# Patient Record
Sex: Female | Born: 1960
Health system: Southern US, Community
[De-identification: ages and names within clinical notes are randomized; demographics above are authoritative.]

## PROBLEM LIST (undated history)

## (undated) DIAGNOSIS — R51 Headache: Secondary | ICD-10-CM

## (undated) DIAGNOSIS — R0981 Nasal congestion: Secondary | ICD-10-CM

## (undated) DIAGNOSIS — I493 Ventricular premature depolarization: Secondary | ICD-10-CM

## (undated) DIAGNOSIS — F419 Anxiety disorder, unspecified: Secondary | ICD-10-CM

## (undated) DIAGNOSIS — Z9889 Other specified postprocedural states: Secondary | ICD-10-CM

## (undated) DIAGNOSIS — R112 Nausea with vomiting, unspecified: Secondary | ICD-10-CM

## (undated) DIAGNOSIS — E039 Hypothyroidism, unspecified: Secondary | ICD-10-CM

## (undated) HISTORY — PX: TONSILLECTOMY: SUR1361

## (undated) HISTORY — PX: ABDOMINAL HYSTERECTOMY: SHX81

## (undated) HISTORY — PX: SHOULDER ARTHROSCOPY: SHX128

## (undated) HISTORY — PX: REDUCTION MAMMAPLASTY: SUR839

## (undated) HISTORY — PX: OTHER SURGICAL HISTORY: SHX169

## (undated) HISTORY — PX: THYROIDECTOMY, PARTIAL: SHX18

---

## 1997-12-23 ENCOUNTER — Encounter: Admission: RE | Admit: 1997-12-23 | Discharge: 1998-03-23 | Payer: Self-pay | Admitting: Obstetrics and Gynecology

## 1998-03-13 ENCOUNTER — Inpatient Hospital Stay (HOSPITAL_COMMUNITY): Admission: AD | Admit: 1998-03-13 | Discharge: 1998-03-16 | Payer: Self-pay | Admitting: Obstetrics and Gynecology

## 1998-05-19 ENCOUNTER — Ambulatory Visit (HOSPITAL_COMMUNITY): Admission: RE | Admit: 1998-05-19 | Discharge: 1998-05-19 | Payer: Self-pay | Admitting: Obstetrics and Gynecology

## 1999-01-07 ENCOUNTER — Other Ambulatory Visit: Admission: RE | Admit: 1999-01-07 | Discharge: 1999-01-07 | Payer: Self-pay | Admitting: Obstetrics and Gynecology

## 1999-01-11 ENCOUNTER — Encounter: Admission: RE | Admit: 1999-01-11 | Discharge: 1999-02-03 | Payer: Self-pay | Admitting: Orthopaedic Surgery

## 1999-05-26 ENCOUNTER — Ambulatory Visit (HOSPITAL_COMMUNITY): Admission: RE | Admit: 1999-05-26 | Discharge: 1999-05-26 | Payer: Self-pay | Admitting: Obstetrics and Gynecology

## 1999-05-26 ENCOUNTER — Encounter (INDEPENDENT_AMBULATORY_CARE_PROVIDER_SITE_OTHER): Payer: Self-pay

## 1999-10-05 ENCOUNTER — Inpatient Hospital Stay (HOSPITAL_COMMUNITY): Admission: RE | Admit: 1999-10-05 | Discharge: 1999-10-08 | Payer: Self-pay | Admitting: Obstetrics and Gynecology

## 1999-10-05 ENCOUNTER — Encounter (INDEPENDENT_AMBULATORY_CARE_PROVIDER_SITE_OTHER): Payer: Self-pay

## 2000-05-24 ENCOUNTER — Other Ambulatory Visit: Admission: RE | Admit: 2000-05-24 | Discharge: 2000-05-24 | Payer: Self-pay | Admitting: Obstetrics and Gynecology

## 2001-05-25 ENCOUNTER — Other Ambulatory Visit: Admission: RE | Admit: 2001-05-25 | Discharge: 2001-05-25 | Payer: Self-pay | Admitting: Obstetrics and Gynecology

## 2001-06-07 ENCOUNTER — Ambulatory Visit (HOSPITAL_BASED_OUTPATIENT_CLINIC_OR_DEPARTMENT_OTHER): Admission: RE | Admit: 2001-06-07 | Discharge: 2001-06-07 | Payer: Self-pay | Admitting: Orthopedic Surgery

## 2001-11-08 ENCOUNTER — Encounter: Payer: Self-pay | Admitting: Family Medicine

## 2001-11-08 ENCOUNTER — Encounter: Admission: RE | Admit: 2001-11-08 | Discharge: 2001-11-08 | Payer: Self-pay | Admitting: Family Medicine

## 2001-12-04 ENCOUNTER — Other Ambulatory Visit: Admission: RE | Admit: 2001-12-04 | Discharge: 2001-12-04 | Payer: Self-pay | Admitting: *Deleted

## 2001-12-13 ENCOUNTER — Encounter: Payer: Self-pay | Admitting: *Deleted

## 2001-12-14 ENCOUNTER — Encounter (INDEPENDENT_AMBULATORY_CARE_PROVIDER_SITE_OTHER): Payer: Self-pay | Admitting: Specialist

## 2001-12-14 ENCOUNTER — Inpatient Hospital Stay (HOSPITAL_COMMUNITY): Admission: RE | Admit: 2001-12-14 | Discharge: 2001-12-15 | Payer: Self-pay | Admitting: *Deleted

## 2002-07-02 ENCOUNTER — Other Ambulatory Visit: Admission: RE | Admit: 2002-07-02 | Discharge: 2002-07-02 | Payer: Self-pay | Admitting: Obstetrics and Gynecology

## 2002-08-23 ENCOUNTER — Encounter: Payer: Self-pay | Admitting: Emergency Medicine

## 2002-08-23 ENCOUNTER — Emergency Department (HOSPITAL_COMMUNITY): Admission: EM | Admit: 2002-08-23 | Discharge: 2002-08-23 | Payer: Self-pay | Admitting: Emergency Medicine

## 2003-07-22 ENCOUNTER — Other Ambulatory Visit: Admission: RE | Admit: 2003-07-22 | Discharge: 2003-07-22 | Payer: Self-pay | Admitting: Obstetrics and Gynecology

## 2003-07-23 ENCOUNTER — Encounter: Admission: RE | Admit: 2003-07-23 | Discharge: 2003-10-21 | Payer: Self-pay | Admitting: Internal Medicine

## 2003-11-10 ENCOUNTER — Encounter: Admission: RE | Admit: 2003-11-10 | Discharge: 2004-02-08 | Payer: Self-pay | Admitting: Internal Medicine

## 2004-03-17 ENCOUNTER — Observation Stay (HOSPITAL_COMMUNITY): Admission: RE | Admit: 2004-03-17 | Discharge: 2004-03-18 | Payer: Self-pay | Admitting: Orthopedic Surgery

## 2004-07-27 ENCOUNTER — Other Ambulatory Visit: Admission: RE | Admit: 2004-07-27 | Discharge: 2004-07-27 | Payer: Self-pay | Admitting: Obstetrics and Gynecology

## 2004-08-13 ENCOUNTER — Encounter: Admission: RE | Admit: 2004-08-13 | Discharge: 2004-08-13 | Payer: Self-pay | Admitting: Gastroenterology

## 2005-01-25 ENCOUNTER — Encounter: Admission: RE | Admit: 2005-01-25 | Discharge: 2005-01-25 | Payer: Self-pay | Admitting: Internal Medicine

## 2005-12-06 ENCOUNTER — Encounter: Admission: RE | Admit: 2005-12-06 | Discharge: 2005-12-06 | Payer: Self-pay | Admitting: Otolaryngology

## 2005-12-19 ENCOUNTER — Encounter: Admission: RE | Admit: 2005-12-19 | Discharge: 2005-12-19 | Payer: Self-pay | Admitting: Internal Medicine

## 2005-12-27 ENCOUNTER — Other Ambulatory Visit: Admission: RE | Admit: 2005-12-27 | Discharge: 2005-12-27 | Payer: Self-pay | Admitting: Interventional Radiology

## 2005-12-27 ENCOUNTER — Encounter: Admission: RE | Admit: 2005-12-27 | Discharge: 2005-12-27 | Payer: Self-pay | Admitting: Internal Medicine

## 2005-12-27 ENCOUNTER — Encounter (INDEPENDENT_AMBULATORY_CARE_PROVIDER_SITE_OTHER): Payer: Self-pay | Admitting: *Deleted

## 2006-02-13 ENCOUNTER — Encounter: Admission: RE | Admit: 2006-02-13 | Discharge: 2006-02-13 | Payer: Self-pay | Admitting: Endocrinology

## 2006-02-22 ENCOUNTER — Encounter (INDEPENDENT_AMBULATORY_CARE_PROVIDER_SITE_OTHER): Payer: Self-pay | Admitting: Specialist

## 2006-02-22 ENCOUNTER — Encounter: Admission: RE | Admit: 2006-02-22 | Discharge: 2006-02-22 | Payer: Self-pay | Admitting: Endocrinology

## 2006-02-22 ENCOUNTER — Other Ambulatory Visit: Admission: RE | Admit: 2006-02-22 | Discharge: 2006-02-22 | Payer: Self-pay | Admitting: Interventional Radiology

## 2006-05-15 ENCOUNTER — Encounter: Admission: RE | Admit: 2006-05-15 | Discharge: 2006-05-15 | Payer: Self-pay | Admitting: Endocrinology

## 2007-02-12 ENCOUNTER — Encounter: Admission: RE | Admit: 2007-02-12 | Discharge: 2007-02-12 | Payer: Self-pay | Admitting: Endocrinology

## 2007-03-23 ENCOUNTER — Encounter: Admission: RE | Admit: 2007-03-23 | Discharge: 2007-03-23 | Payer: Self-pay | Admitting: Obstetrics and Gynecology

## 2007-08-08 ENCOUNTER — Encounter: Admission: RE | Admit: 2007-08-08 | Discharge: 2007-08-08 | Payer: Self-pay | Admitting: Obstetrics and Gynecology

## 2008-01-01 ENCOUNTER — Encounter (INDEPENDENT_AMBULATORY_CARE_PROVIDER_SITE_OTHER): Payer: Self-pay | Admitting: Surgery

## 2008-01-01 ENCOUNTER — Ambulatory Visit (HOSPITAL_COMMUNITY): Admission: RE | Admit: 2008-01-01 | Discharge: 2008-01-02 | Payer: Self-pay | Admitting: Surgery

## 2008-12-10 ENCOUNTER — Encounter: Admission: RE | Admit: 2008-12-10 | Discharge: 2008-12-10 | Payer: Self-pay | Admitting: Internal Medicine

## 2009-02-27 ENCOUNTER — Emergency Department (HOSPITAL_COMMUNITY): Admission: EM | Admit: 2009-02-27 | Discharge: 2009-02-27 | Payer: Self-pay | Admitting: Emergency Medicine

## 2009-10-02 ENCOUNTER — Encounter: Admission: RE | Admit: 2009-10-02 | Discharge: 2009-10-02 | Payer: Self-pay | Admitting: Internal Medicine

## 2009-12-18 ENCOUNTER — Encounter: Admission: RE | Admit: 2009-12-18 | Discharge: 2009-12-18 | Payer: Self-pay | Admitting: Surgery

## 2010-03-20 ENCOUNTER — Ambulatory Visit: Payer: Self-pay | Admitting: Oral Surgery

## 2010-04-25 ENCOUNTER — Encounter: Payer: Self-pay | Admitting: Endocrinology

## 2010-06-07 ENCOUNTER — Other Ambulatory Visit: Payer: Self-pay | Admitting: Internal Medicine

## 2010-06-07 DIAGNOSIS — M545 Low back pain, unspecified: Secondary | ICD-10-CM

## 2010-06-08 ENCOUNTER — Ambulatory Visit
Admission: RE | Admit: 2010-06-08 | Discharge: 2010-06-08 | Disposition: A | Discharge status: Home / Self Care | Payer: Federal, State, Local not specified - PPO | Source: Ambulatory Visit | Attending: Internal Medicine | Admitting: Internal Medicine

## 2010-06-08 DIAGNOSIS — M545 Low back pain, unspecified: Secondary | ICD-10-CM

## 2010-08-17 NOTE — Op Note (Signed)
Alexis Knight, Alexis Knight                  ACCOUNT NO.:  [id number]   MEDICAL RECORD NO.:  [id number]          PATIENT TYPE:  OIB   LOCATION:  1336                         FACILITY:  [facility]   PHYSICIAN:  Velora Heckler, MD      DATE OF BIRTH:  1960/09/24   DATE OF PROCEDURE:  01/01/2008  DATE OF DISCHARGE:                               OPERATIVE REPORT   PREOPERATIVE DIAGNOSIS:  Left thyroid nodules.   POSTOPERATIVE DIAGNOSIS:  Left thyroid nodules.   PROCEDURE:  Left thyroid lobectomy.   SURGEON:  Velora Heckler, MD, FACS   ASSISTANT:  Claud Kelp, MD, FACS   ANESTHESIA:  General.   ESTIMATED BLOOD LOSS:  Minimal.   PREPARATION:  Betadine.   COMPLICATIONS:  None.   INDICATIONS:  The patient is a 50 year old white female from [place],  [place].  She has a 2-year history of thyroid nodules.  She has  had thorough evaluation including thyroid ultrasound and 2 previous fine-  needle aspiration biopsies.  Fine-needle aspiration cytology was  indeterminate.  Nuclear scan showed a cold nodule in the left thyroid  lobe.  Patient has been on thyroid hormone suppression.  At the  recommendation of her endocrinologist, Dr. Talmage Coin, the patient  comes to surgery for left thyroid lobectomy for definitive diagnosis.   BODY OF REPORT:  Procedure is done in OR #11 at the [facility].  The patient is brought to the operating room,  placed in a supine position on the operating room table.  Following  administration of general anesthesia, the patient is positioned and then  prepped and draped in usual strict aseptic fashion.  After ascertaining  that an adequate level of anesthesia had been achieved, a Kocher  incision is made with a #15 blade.  Dissection was carried through  subcutaneous tissues and platysma.  Hemostasis is obtained with  electrocautery.  Skin flaps are developed cephalad and caudad from the  thyroid notch to the sternal notch.  A Mahorner  self-retaining retractor  is placed for exposure.  Strap muscles are incised in the midline.  Initially, the right thyroid lobe is briefly exposed.  It is palpated.  There are no dominant nor discrete masses in the right lobe.  There is  no obvious lymphadenopathy.   Next, we turned our attention to the left thyroid lobe.  Strap muscles  were reflected laterally.  Left lobe was exposed with gentle blunt  dissection.  Middle thyroid vein is divided between Ligaclips with  Harmonic scalpel.  Gland is gently mobilized.  The superior pole vessels  are divided between medium Ligaclips with the Harmonic scalpel.  Parathyroid tissue is identified and preserved.  Inferior venous  tributaries are divided between Ligaclips with the Harmonic scalpel.  Gland is rolled anteriorly.  Recurrent nerve is identified and preserved  along its course.  Branches of the inferior thyroid artery are divided  between small Ligaclips with the Harmonic scalpel.  Dissection is  carried down to the ligament of Allyson Sabal which is transected with the  electrocautery.  Gland is rolled  anteriorly up and onto the anterior  surface of the trachea.  There is no significant pyramidal lobe.  Isthmus is mobilized across the midline.  The thyroid parenchyma is  divided with the Harmonic scalpel at the junction of the isthmus and  right thyroid lobe.  Specimen is submitted to pathology.  Dr. Jimmy Picket did a frozen section and touch preps.  He sees no evidence of  malignancy in these follicular lesions.   Good hemostasis was obtained throughout the operative field.  Surgicel  was placed in the operative field.  Strap muscles were reapproximated in  the midline with interrupted #3-0 Vicryl sutures.  Platysma was closed  with interrupted #3-0 Vicryl sutures.  Skin was closed with a running #4-  0 Monocryl subcuticular suture.  Wound is washed and dried, Benzoin and  Steri-Strips are applied.  Sterile dressings are applied.  The  patient  is awakened from anesthesia and brought to the recovery room in stable  condition.  The patient tolerated the procedure well.      Velora Heckler, MD  Electronically Signed     TMG/MEDQ  D:  01/01/2008  T:  01/01/2008  Job:  [id number]   cc:   Talmage Coin, M.D.   Candyce Churn, M.D.  Fax: [phone]

## 2010-08-20 NOTE — Op Note (Signed)
[facility]  Patient:    Alexis Knight, Alexis Knight                         MRN: [id number] Proc. Date: 10/05/99 Adm. Date:  [id number] Attending:  Osborn Coho                           Operative Report  PREOPERATIVE DIAGNOSIS:       Menometrorrhagia.  POSTOPERATIVE DIAGNOSIS:      Menometrorrhagia.  OPERATION:                    Total abdominal hysterectomy.  SURGEON:                      Mark E. Dareen Piano, M.D.  ASSISTANT:                    Marcelle Overlie, M.D.  ANESTHESIA:                   General endotracheal.  ANTIBIOTICS:                  Ancef 1 g.  DRAINS:                       Foley.  SPECIMENS:                    Cervix, uterus sent to pathology.  ESTIMATED BLOOD LOSS:         300 cc.  COMPLICATIONS:                None.  DESCRIPTION OF PROCEDURE:  The patient was taken to the operating room where general anesthesia was administered without complications. She was then placed in the dorsal supine position. She was prepped with Hibiclens and a Foley catheter was placed. She was then draped in the usual fashion for this procedure. A Pfannenstiel incision was made through the previous scar. This was carried down to the fascia. The fascia was entered through the midline and extended laterally. The rectus muscles were then sharply dissected from the fascia and divided in the midline and taken superiorly and inferiorly. Parietoperitoneum was entered sharply. The patient was then placed in Trendelenburg position and OSullivan-OConnor retractor was placed and the bowel was packed away. Prior to packing the bowel, the liver, gallbladder, kidneys were all palpated and felt to be normal. There was also no pelvic or periaortic lymphadenopathy. Two Kellys were used to grasp the uterus. The round ligament on the right was then clamped, cauterized, and ligated with 0 Monocryl suture anteriorly. The broad ligament was opened. The bladder flap was  taken down sharply. The infundibulopelvic ligament was then isolated. The ovarian ligament and fallopian tube were then clamped, cut, and ligated x 2 with 0 Monocryl suture. The uterine vessels were then skeletonized, clamped, cut, and ligated with 0 Monocryl suture. A similar procedure was performed on the opposite side. Both ovaries appeared to be normal. The patient did have evidence of past tubal ligation with clips. These were removed during the procedure. The cardinal ligaments were the serially clamped, cut, and ligated with 0 Monocryl suture. Once the level of the external os was reached, the vagina was clamped on both vaginal angles. These angles were ligated with 0 Monocryl suture in a Heaney fashion. The remaining cervix was then  circumscribed and the specimen was removed. The vaginal was closed using 0 Monocryl suture in interrupted fashion. The pelvis was then copiously irrigated. Small areas of bleeding were made hemostatic. At this point, the retractor and packing were removed. The parietoperitoneum was closed using 0 Monocryl suture in a running fashion. The rectus muscles were reapproximated in the midline using 2-0 Monocryl suture in a running fashion. The fascia was closed using an 0 Monocryl suture in a running fashion. The subcuticular tissue was made hemostatic with the Bovie. The skin was closed with stainless steel clips. The patient tolerated the procedure well. She was taken to the recovery room in stable condition. Instrument and lap counts were correct x 2. D:  10/05/99 TD:  10/05/99 Job: [id number]

## 2010-08-20 NOTE — Op Note (Signed)
[facility]  Patient:    Alexis Knight, Alexis Knight                         MRN: [id number] Proc. Date: 05/26/99 Adm. Date:  [id number] Attending:  Osborn Coho                           Operative Report  PREOPERATIVE DIAGNOSIS:       Menorrhagia.  POSTOPERATIVE DIAGNOSIS:      Menorrhagia.  OPERATION:                    Hysteroscopy.  Dilatation and curettage.  SURGEON:                      Mark E. Dareen Piano, M.D.  ASSISTANT:  ANESTHESIA:                   General with LMA.  ANTIBIOTICS:                  Ancef 1 gram.  ESTIMATED BLOOD LOSS:         20 cc.  COMPLICATIONS:                None.  SPECIMENS:                    Endometrial and endocervical curettings sent to pathology.  COMPLICATIONS:                None.  DRAINS:                       Red rubber catheter to bladder.  DESCRIPTION OF PROCEDURE:     The patient was taken to the operating room where she was placed in the dorsal supine position.  A general anesthesia was administered without complications.  She was then placed in the dorsal lithotomy position and prepped with Hibiclens.  Her bladder was drained with a red rubber catheter.  A  sterile speculum was placed in the vagina.  A single tooth tenaculum was used to grasp the anterior cervical lip.  The cervical os was then dilated to a 27 [place]. The uterus was sounded to 7 cm.  Once the cervical os was dilated, the hysteroscope was advanced through the endocervical canal.  This appeared to be very normal.  There was no evidence of lesions, polyps, or fibroids.  On entering the uterine  cavity, both ostia were visualized and appeared to be normal.  Again no polyps,  lesions, or leiomyomata were visualized.  The endometrial lining did not appear to be especially thickened.  At this point, the hysteroscope was removed. Endocervical curettage was performed followed by an endometrial curettage. Specimens were sent to  pathology.  The patient tolerated the procedure well. She was taken to the recovery room in stable condition.  Sponge, needle, and instrument counts were correct.  The patient was discharged to home with Anaprox double strength to take p.r.n.  She will follow up in the office in four weeks. DD:  05/26/99 TD:  05/26/99 Job: [id number]

## 2010-08-20 NOTE — Discharge Summary (Signed)
[facility]  Patient:    Alexis Knight, Alexis Knight                         MRN: [id number] Adm. Date:  [id number] Disc. Date: [id number] Attending:  Osborn Coho                           Discharge Summary  PRINCIPAL DISCHARGE DIAGNOSIS:                    Menometrorrhagia.  PRINCIPAL PROCEDURE:          Total abdominal hysterectomy.  HISTORY OF PRESENT ILLNESS:   Ms. Alexis Knight is a 50 year old white female G1 P1 who was admitted for total abdominal hysterectomy secondary to worsening menometrorrhagia and dysmenorrhea.  For a complete description of the events which lead up to this admission, please see the dictated History and Physical.  HOSPITAL COURSE:              The patient underwent a total abdominal hysterectomy on October 05, 1999.  Estimated blood loss was 300 cc.  This was an uncomplicated procedure.  Patients pathology revealed cervical endometriosis, small leiomyoma, otherwise the pathology was all benign.  For a complete description of the operative procedure, please see the dictated Operative Note.  Patients postoperative hemoglobin was 11.2, preoperative 13.2. Patients hospital course was benign.  At the time of discharge, patient was eating a regular diet and ambulating without difficulty.  Patient had passed flatus.  Her incision was healing well.  DISPOSITION:                  Patient was discharged to home.  MEDICATIONS:                  Tylox to take p.r.n.  FOLLOW-UP:                    She was instructed to follow up in the office in four weeks. DD:  10/08/99 TD:  10/11/99 Job: [id number]

## 2010-08-20 NOTE — Op Note (Signed)
NAME:  Alexis Knight, Alexis Knight                            ACCOUNT NO.:  [id number]   MEDICAL RECORD NO.:  [id number]                   PATIENT TYPE:  INP   LOCATION:  2550                                 FACILITY:  [facility]   PHYSICIAN:  Veverly Fells. Arletha Grippe, M.D.             DATE OF BIRTH:  05-24-1960   DATE OF PROCEDURE:  12/14/2001  DATE OF DISCHARGE:                                 OPERATIVE REPORT   PREOPERATIVE DIAGNOSIS:  Cystic right neck mass, probable cystic hygroma.   POSTOPERATIVE DIAGNOSIS:  Cystic right neck mass, probable cystic hygroma.   PROCEDURE:  Right supraomohyoid neck dissection and removal of cystic neck  mass.   SURGEON:  Veverly Fells. Arletha Grippe, M.D.   ASSISTANT:  Kinnie Scales. Annalee Genta, M.D.   ANESTHESIA:  General endotracheal.   FLUIDS REPLACED:  Approximately 700 cc crystalloid.   ESTIMATED BLOOD LOSS:  Less than 30 cc.   URINE OUTPUT:  Not measured.   INDICATION FOR PROCEDURE:  This is an otherwise healthy 50 year old white  female patient of Alexis Knight, M.D., who has a history of a slowly  enlarging neck mass involving the right side of her neck, which has been  growing over the past one to two months.  It has been soft, mobile, and  nontender.  She has no fevers or chills or any constitutional symptoms.  It  is of note that when she was 48 months of age, she did have an excision of a  right neck mass.  It was uncertain exactly what that was.  Physical  examination did show a compressible neck mass approximately 3 x 4 cm in  dimension.  A CT scan of the neck did confirm this and a diffuse cystic mass  involving the right neck.  Fine needle aspiration biopsy did show some  lymphocytes and stromal fragments but no signs of any gross carcinoma.  Based on her history and physical examination, I have recommended proceeding  with the above-noted surgical procedure.  I have discussed extensively with  her the risks and benefits of surgery, including the risks of  general  anesthesia, infection, bleeding, and the possibility of injury to the  cranial nerves, including cranial nerves V, VII, XI, and XII, injury to  great vessels, and the normal recovery period expected after this type of  surgery, including the type of incision we would use and the type of drain  we would used.  I have entertained any questions, answered them  appropriately, informed consent has been obtained, and patient presents for  the above-noted procedure.   OPERATIVE FINDINGS:  A kind of diffuse cystic right neck mass up to the  level of the right submandibular gland, anteriorly to the midline,  inferiorly down to the omohyoid muscle, and posteriorly to the SCM, and up  to and abutting the great vessels.   DESCRIPTION OF PROCEDURE:  The patient was brought in  the operating room and  placed in the supine position.  General endotracheal anesthesia administered  via the anesthesiologist without complications.  The patient was  administered 600 mg of Clindamycin IV x1 and 10 mg of Decadron IV x1.  The  shoulders were placed in a shoulder roll and the head was placed in a  doughnut, and the head of the table was turned 90 degrees.  The incision in  a natural skin crease just at the level of the thyroid cartilage  horizontally was marked out approximately 5-6 cm in length.  It was then  injected with a total of 9 cc of 1% lidocaine solution and 1:100,000  epinephrine.  After waiting approximately five to 10 minutes, the skin  incision was made with a scalpel blade and dissection was carried down  through the subcutaneous tissues and through platysma.  Subplatysmal planes  were elevated superiorly and inferiorly.  The large cystic mass was  identified.  It was traced up superiorly up to the level of the inferior  aspect of the submandibular gland.  The marginal mandibular nerve was  identified and was not transected.  It was then sharply freed off of this  fascia overlying the  submandibular gland and traced anteriorly.  The  anterior extent was then dissected down onto strap muscles sharply using the  bipolar Metzenbaum scissors, and this was carried down inferiorly until  omohyoid muscle was identified.  Dissection inferiorly was carried down to  omohyoid muscle, which freed of this mass using the bipolar Metzenbaum  scissors.  Next the anterior border of the SCM was identified and was  skeletonized.  The mass was then dissected off the SCM and then off the  great vessels using both blunt and sharp dissection.  Once it was mobilized,  it was removed and sent to surgical pathology for permanent section  analysis.  Bleeding from the area was controlled with electrocautery.  The  wound was then irrigated with copious amounts of irrigation fluid and  suctioned dry.  There was no evidence of any active bleeding, and there was  no evidence of any residual mass.  A separate stab incision was made  involving the right inferior neck flap, through which a 7 mm flat, fully-  fluted Blake drain was placed through the incision, into the wound, and was  sutured with a 2-0 silk stitch.  The drain was trimmed the appropriate size.  Platysmal layers were then reapproximated with multiple interrupted 3-0  Vicryl  suture, subcutaneous layers were reapproximated using multiple  interrupted 4-0 Vicryl suture, and final skin closure was achieved with a  running interlocking 5-0 nylon stitch.  Bacitracin ointment was placed over  the wound without difficulty.  There were no packs.  The above-noted closed-  suction drain was placed.  The specimen sent was the right cystic neck mass.  The patient tolerated the procedure well without complications, was  extubated in the operating room and transferred to recovery in stable  condition with sponge, instrument, and needle counts correct at the end of the procedure.  Total duration of the procedure was approximately two hours.  The patient  will be admitted for overnight recovery.  Once recovered well  and the drain output is minimal, we will probably removed it on 12/15/01.  We  will send her home on Levaquin 500 mg p.o. q.d. for 10 days; Vicodin #30  with two refills, one to two p.o. q.4h. p.r.n. pain; Vioxx 50 mg tablets,  one  tablet p.o. q.d. for 10 days.  She will be asked to put bacitracin  ointment on the wound three times daily, and will follow up with her in the  office for suture removal on 9/19 at 1:50 p.m.                                               Veverly Fells. Arletha Grippe, M.D.    MDR/MEDQ  D:  12/14/2001  T:  12/14/2001  Job:  [id number]   cc:   Alexis Knight, M.D.

## 2010-08-20 NOTE — Op Note (Signed)
Alexis Knight, MCNAY                  ACCOUNT NO.:  [id number]   MEDICAL RECORD NO.:  [id number]          PATIENT TYPE:  OBV   LOCATION:  0482                         FACILITY:  [facility]   PHYSICIAN:  Georges Lynch. Gioffre, M.D.DATE OF BIRTH:  1960/09/05   DATE OF PROCEDURE:  03/17/2004  DATE OF DISCHARGE:                                 OPERATIVE REPORT   SURGEON:  Georges Lynch. Darrelyn Hillock, M.D.   ASSISTANT:  Ebbie Ridge. Paitsel, P.A.   PREOPERATIVE DIAGNOSIS:  Complete tear of the rotator cuff tendon, central  portion, left shoulder.   POSTOPERATIVE DIAGNOSIS:  Complete tear of the rotator cuff tendon, central  portion, left shoulder.   OPERATION:  1.  Repair of a complete tear of the rotator cuff tendon, left shoulder.  2.  TissueMend graft for repair of the rotator cuff tendon on the left.  3.  Partial acromionectomy and acromioplasty, left shoulder.   PROCEDURE:  Under general anesthesia, routine orthopedic prep and draping of  the left shoulder was carried out. At this time, the patient had 1 g of IV  Ancef. Prior to general anesthesia, she had a interscalene nerve block.  Following this, an incision was made over the anterior aspect of the left  shoulder. Bleeders identified and cauterized. I split the deltoid tendon  from the acromion and by sharp dissection and exposed the acromion. I then  split the proximal portion of the deltoid. At this time, I noted he had a  marked overgrowth and thickening and downsloping of the acromion. In fact,  the bone was literally imbedding down into a large ventral defect. She had a  complete full thickness tear of the rotator cuff tendon. The tendon was  exposed, and then as I said, we then protected the tendon with a Bennett  retractor and then did a partial acromionectomy and acromioplasty utilizing  the oscillating saw and the bur. Once this was completed, we then burred  down the lateral articular surface of the humerus. I then inserted 2 multi-  tack  sutures into the proximal humerus and then brought the sutures up  through the distal tear of the cuff and sutured that down in place. We then  utilized a TissueMend 4 x 4 double thickness graft to the graft the tendon  repair site. Once this was completed, we irrigated out the shoulder again  and then reapproximated the delta tendon and muscle in the usual fashion.  The subcu was closed with 0 Vicryl, skin with mental staples and sterile  Neosporin dressing was applied. She was placed in a shoulder immobilizer  postoperatively.      RAG/MEDQ  D:  03/17/2004  T:  03/17/2004  Job:  [id number]

## 2010-08-20 NOTE — Op Note (Signed)
[facility]  Patient:    Alexis Knight, Alexis Knight Visit Number: [id number] MRN: [id number]          Service Type: DSU Location: [facility] Attending Physician:  Ronne Binning Dictated by:   Nicki Reaper, M.D. Proc. Date: 06/07/01 Admit Date:  06/07/2001                             Operative Report  PREOPERATIVE DIAGNOSIS:  Carpal tunnel syndrome, left hand.  POSTOPERATIVE DIAGNOSIS:  Carpal tunnel syndrome, left hand.  OPERATION:  Decompression left median nerve.  SURGEON:  Nicki Reaper, M.D.  ASSISTANT:  Joaquin Courts, R.N.  ANESTHESIA:  Forearm-based IV regional.  DATE OF OPERATION:  June 07, 2001  ANESTHESIOLOGIST:  Cliffton Asters. Ivin Booty, M.D.  HISTORY:  The patient is a 50 year old female with a history of carpal tunnel syndrome, EMG nerve conduction is positive, which has not responded to conservative treatment.  PROCEDURE:  The patient was brought to the operating room where a forearm-based IV regional anesthetic was carried out without difficulty.  She was prepped and draped using Betadine scrub and solution with the left arm free.  A longitudinal incision was made in the palm and carried down through the subcutaneous tissue.  Bleeders were electrocauterized.  The palmar fascia was split, the superficial palmar arch identified, the flexor tendon tendon to the ring and little finger identified to the ulnar side of the median nerve. The carpal retinaculum was incised with sharp dissection.  A right angle and Sewall retractor were placed between the skin and forearm fascia, the fascia was released for approximately 3 cm proximal to the wrist crease under direct vision.  A very ulnar motor branch was noted to penetrate into the ulnar aspect of the retinaculum.  This was maintained.  No further lesions were identified.  The tenosynovial tissue was moderately thickened.  The wound was irrigated.  The skin was closed with interrupted 5-0 nylon sutures.  A  sterile compressive dressing and splint were applied.  The patient tolerated the procedure well and was taken to the recovery room for observation in satisfactory condition.  DISPOSITION:  She is discharged home to return to [facility] in one week on Vicodin and Septra DS. Dictated by:   Nicki Reaper, M.D. Attending Physician:  Ronne Binning DD:  06/07/01 TD:  06/07/01 Job: [id number]

## 2010-08-20 NOTE — H&P (Signed)
[facility]  Patient:    Alexis Knight, Alexis Knight                         MRN: [id number] Adm. Date:  [id number] Attending:  Osborn Coho                         History and Physical  HISTORY OF PRESENT ILLNESS:       Alexis Knight is a 50 year old white female, G1, P1, who has a several-year history of worsening metromenorrhagia and dysmenorrhea.  These symptoms have been treated with oral contraceptive pills with minimal results.  Also, the patient has significant side effects from the oral contraceptive pill.  The patient has also had a D&C with hysteroscopy which did not improve the patients symptoms.  The patient was also advised t lose weight with little success.  Prior to having the procedure offered to the patient, an endometrial ablation was discussed; however, the patient was fearful of this in light of the family history of endometrial carcinoma.  ALLERGIES:                        The patient is allergic to PENICILLIN.  SOCIAL HISTORY:                   The patient denies smoking, alcohol, or drug use.  CURRENT MEDICATIONS:              None.  PAST SURGICAL HISTORY:            D&C hysteroscopy, bilateral tubal ligation, and cesarean section.  PHYSICAL EXAMINATION:  GENERAL:                          The patient is an overweight white female in no apparent distress.  HEENT:                            Exam within normal limits.  LUNGS:                            Clear to auscultation.  CARDIOVASCULAR:                   Regular rate and rhythm without murmur.  ABDOMEN:                          Nontender, nondistended.  There are no palpable masses or organomegaly.  EXTREMITIES:                      Within normal limits.  PELVIC:                           Normal external genitalia.  The vagina is without lesions or discharge.  The cervix is nulliparous.  There is no cervical motion tenderness.  The uterus is anteverted and nontender.   There are no adnexal masses.  IMPRESSION:                       1. Metromenorrhagia.  2. Severe dysmenorrhea.  PLAN:                             Proceed with total abdominal hysterectomy. D:  10/07/99 TD:  10/07/99 Job: [id number]

## 2010-10-10 ENCOUNTER — Ambulatory Visit (INDEPENDENT_AMBULATORY_CARE_PROVIDER_SITE_OTHER): Payer: Federal, State, Local not specified - PPO

## 2010-10-10 ENCOUNTER — Inpatient Hospital Stay (INDEPENDENT_AMBULATORY_CARE_PROVIDER_SITE_OTHER)
Admission: RE | Admit: 2010-10-10 | Discharge: 2010-10-10 | Disposition: A | Discharge status: Home / Self Care | Payer: Federal, State, Local not specified - PPO | Source: Ambulatory Visit | Attending: Emergency Medicine | Admitting: Emergency Medicine

## 2010-10-10 DIAGNOSIS — IMO0002 Reserved for concepts with insufficient information to code with codable children: Secondary | ICD-10-CM

## 2010-10-10 DIAGNOSIS — S8000XA Contusion of unspecified knee, initial encounter: Secondary | ICD-10-CM

## 2010-10-20 ENCOUNTER — Other Ambulatory Visit: Payer: Self-pay | Admitting: Obstetrics and Gynecology

## 2010-11-29 ENCOUNTER — Other Ambulatory Visit: Payer: Self-pay | Admitting: Internal Medicine

## 2010-11-29 ENCOUNTER — Ambulatory Visit
Admission: RE | Admit: 2010-11-29 | Discharge: 2010-11-29 | Disposition: A | Discharge status: Home / Self Care | Payer: Federal, State, Local not specified - PPO | Source: Ambulatory Visit | Attending: Internal Medicine | Admitting: Internal Medicine

## 2010-11-29 DIAGNOSIS — R0600 Dyspnea, unspecified: Secondary | ICD-10-CM

## 2010-11-29 MED ORDER — IOHEXOL 300 MG/ML  SOLN
125.0000 mL | Freq: Once | INTRAMUSCULAR | Status: AC | PRN
  Administered 2010-11-29: 125 mL via INTRAVENOUS

## 2011-01-03 LAB — DIFFERENTIAL
Basophils Absolute: 0
Basophils Relative: 1
Eosinophils Absolute: 0.2
Eosinophils Relative: 2
Lymphocytes Relative: 22
Lymphs Abs: 1.6
Monocytes Absolute: 0.4
Monocytes Relative: 5
Neutro Abs: 5.1
Neutrophils Relative %: 70

## 2011-01-03 LAB — URINALYSIS, ROUTINE W REFLEX MICROSCOPIC
Bilirubin Urine: NEGATIVE
Glucose, UA: NEGATIVE
Hgb urine dipstick: NEGATIVE
Ketones, ur: NEGATIVE
Nitrite: NEGATIVE
Protein, ur: NEGATIVE
Specific Gravity, Urine: 1.022
Urobilinogen, UA: 1
pH: 6

## 2011-01-03 LAB — CBC
HCT: 39.9
Hemoglobin: 13.1
MCHC: 32.8
MCV: 81.5
Platelets: 347
RBC: 4.9
RDW: 14.7
WBC: 7.2

## 2011-01-03 LAB — BASIC METABOLIC PANEL
BUN: 9
CO2: 26
Calcium: 9.1
Chloride: 109
Creatinine, Ser: 0.65
GFR calc Af Amer: >60
GFR calc non Af Amer: >60
Glucose, Bld: 114 — ABNORMAL HIGH
Potassium: 3.5
Sodium: 140

## 2011-01-03 LAB — PROTIME-INR
INR: 1
Prothrombin Time: 13

## 2011-01-03 LAB — PREGNANCY, URINE: Preg Test, Ur: NEGATIVE

## 2011-11-18 ENCOUNTER — Other Ambulatory Visit: Payer: Self-pay | Admitting: Obstetrics and Gynecology

## 2012-01-02 ENCOUNTER — Encounter (HOSPITAL_COMMUNITY): Payer: Self-pay | Admitting: Pharmacist

## 2012-01-11 ENCOUNTER — Encounter (HOSPITAL_COMMUNITY)
Admission: RE | Admit: 2012-01-11 | Discharge: 2012-01-11 | Disposition: A | Discharge status: Home / Self Care | Payer: Federal, State, Local not specified - PPO | Source: Ambulatory Visit | Attending: Obstetrics and Gynecology | Admitting: Obstetrics and Gynecology

## 2012-01-11 ENCOUNTER — Inpatient Hospital Stay (HOSPITAL_COMMUNITY): Admission: RE | Admit: 2012-01-11 | Payer: Federal, State, Local not specified - PPO | Source: Ambulatory Visit

## 2012-01-11 ENCOUNTER — Encounter (HOSPITAL_COMMUNITY): Payer: Self-pay

## 2012-01-11 HISTORY — DX: Nasal congestion: R09.81

## 2012-01-11 HISTORY — DX: Nausea with vomiting, unspecified: R11.2

## 2012-01-11 HISTORY — DX: Other specified postprocedural states: Z98.890

## 2012-01-11 HISTORY — DX: Headache: R51

## 2012-01-11 HISTORY — DX: Anxiety disorder, unspecified: F41.9

## 2012-01-11 HISTORY — DX: Hypothyroidism, unspecified: E03.9

## 2012-01-11 LAB — CBC
HCT: 42.2 % (ref 36.0–46.0)
Hemoglobin: 13.5 g/dL (ref 12.0–15.0)
MCH: 26.4 pg (ref 26.0–34.0)
MCHC: 32 g/dL (ref 30.0–36.0)
MCV: 82.4 fL (ref 78.0–100.0)
Platelets: 372 10*3/uL (ref 150–400)
RBC: 5.12 MIL/uL — ABNORMAL HIGH (ref 3.87–5.11)
RDW: 14 % (ref 11.5–15.5)
WBC: 10.9 10*3/uL — ABNORMAL HIGH (ref 4.0–10.5)

## 2012-01-11 LAB — COMPREHENSIVE METABOLIC PANEL
ALT: 18 U/L (ref 0–35)
AST: 15 U/L (ref 0–37)
Albumin: 3.6 g/dL (ref 3.5–5.2)
Alkaline Phosphatase: 107 U/L (ref 39–117)
BUN: 14 mg/dL (ref 6–23)
CO2: 26 mEq/L (ref 19–32)
Calcium: 8.8 mg/dL (ref 8.4–10.5)
Chloride: 103 mEq/L (ref 96–112)
Creatinine, Ser: 0.71 mg/dL (ref 0.50–1.10)
GFR calc Af Amer: >90 mL/min (ref >90)
GFR calc non Af Amer: >90 mL/min (ref >90)
Glucose, Bld: 94 mg/dL (ref 70–99)
Potassium: 4.1 mEq/L (ref 3.5–5.1)
Sodium: 138 mEq/L (ref 135–145)
Total Bilirubin: 0.2 mg/dL — ABNORMAL LOW (ref 0.3–1.2)
Total Protein: 7.1 g/dL (ref 6.0–8.3)

## 2012-01-11 NOTE — Patient Instructions (Addendum)
Your procedure is scheduled on:01/17/12  Enter through the Main Entrance at :0600 Pick up desk phone and dial [phone] and inform us of your arrival.  Please call [phone] if you have any problems the morning of surgery.  Remember: Do not eat after midnight:Monday Do not drink after:midnight Monday  Take these meds the morning of surgery with a sip of water:Synthroid  DO NOT wear jewelry, eye make-up, lipstick,body lotion, or dark fingernail polish. Do not shave for 48 hours prior to surgery.  If you are to be admitted after surgery, leave suitcase in car until your room has been assigned. Patients discharged on the day of surgery will not be allowed to drive home.   Remember to use your Hibiclens as instructed.

## 2012-01-16 ENCOUNTER — Other Ambulatory Visit: Payer: Self-pay | Admitting: Obstetrics and Gynecology

## 2012-01-17 ENCOUNTER — Encounter (HOSPITAL_COMMUNITY): Payer: Self-pay | Admitting: Anesthesiology

## 2012-01-17 ENCOUNTER — Ambulatory Visit (HOSPITAL_COMMUNITY): Payer: Federal, State, Local not specified - PPO | Admitting: Anesthesiology

## 2012-01-17 ENCOUNTER — Encounter (HOSPITAL_COMMUNITY)
Admission: RE | Disposition: A | Discharge status: Home / Self Care | Payer: Self-pay | Source: Ambulatory Visit | Attending: Obstetrics and Gynecology

## 2012-01-17 ENCOUNTER — Encounter (HOSPITAL_COMMUNITY): Payer: Self-pay | Admitting: *Deleted

## 2012-01-17 ENCOUNTER — Observation Stay (HOSPITAL_COMMUNITY)
Admission: RE | Admit: 2012-01-17 | Discharge: 2012-01-18 | Disposition: A | Discharge status: Home / Self Care | Payer: Federal, State, Local not specified - PPO | Source: Ambulatory Visit | Attending: Obstetrics and Gynecology | Admitting: Obstetrics and Gynecology

## 2012-01-17 DIAGNOSIS — N393 Stress incontinence (female) (male): Principal | ICD-10-CM | POA: Insufficient documentation

## 2012-01-17 DIAGNOSIS — K649 Unspecified hemorrhoids: Secondary | ICD-10-CM | POA: Insufficient documentation

## 2012-01-17 HISTORY — PX: BLADDER SUSPENSION: SHX72

## 2012-01-17 SURGERY — URETHROPEXY, USING TRANSVAGINAL TAPE
Anesthesia: General | Site: Vagina | Wound class: Clean Contaminated

## 2012-01-17 MED ORDER — TOPIRAMATE 25 MG PO TABS
25.0000 mg | ORAL_TABLET | Freq: Every day | ORAL | Status: DC
  Administered 2012-01-17: 25 mg via ORAL
  Filled 2012-01-17: qty 1

## 2012-01-17 MED ORDER — ONDANSETRON HCL 4 MG PO TABS: 8.0000 mg | ORAL_TABLET | Freq: Three times a day (TID) | ORAL | Status: DC | PRN

## 2012-01-17 MED ORDER — KETOROLAC TROMETHAMINE 30 MG/ML IJ SOLN
30.0000 mg | Freq: Four times a day (QID) | INTRAMUSCULAR | Status: DC
  Administered 2012-01-17 – 2012-01-18 (×2): 30 mg via INTRAVENOUS
  Filled 2012-01-17 (×2): qty 1

## 2012-01-17 MED ORDER — KETOROLAC TROMETHAMINE 30 MG/ML IJ SOLN: 30.0000 mg | Freq: Four times a day (QID) | INTRAMUSCULAR | Status: DC

## 2012-01-17 MED ORDER — DEXAMETHASONE SODIUM PHOSPHATE 10 MG/ML IJ SOLN
INTRAMUSCULAR | Status: AC
  Filled 2012-01-17: qty 1

## 2012-01-17 MED ORDER — ONDANSETRON HCL 4 MG/2ML IJ SOLN: 4.0000 mg | Freq: Four times a day (QID) | INTRAMUSCULAR | Status: DC | PRN

## 2012-01-17 MED ORDER — PROPOFOL 10 MG/ML IV EMUL
INTRAVENOUS | Status: AC
  Filled 2012-01-17: qty 20

## 2012-01-17 MED ORDER — DEXAMETHASONE SODIUM PHOSPHATE 10 MG/ML IJ SOLN
INTRAMUSCULAR | Status: DC | PRN
  Administered 2012-01-17: 10 mg via INTRAVENOUS

## 2012-01-17 MED ORDER — MIDAZOLAM HCL 2 MG/2ML IJ SOLN
INTRAMUSCULAR | Status: AC
  Filled 2012-01-17: qty 2

## 2012-01-17 MED ORDER — ESTRADIOL 0.1 MG/GM VA CREA
TOPICAL_CREAM | VAGINAL | Status: AC
  Filled 2012-01-17: qty 42.5

## 2012-01-17 MED ORDER — INDIGOTINDISULFONATE SODIUM 8 MG/ML IJ SOLN
INTRAMUSCULAR | Status: AC
  Filled 2012-01-17: qty 5

## 2012-01-17 MED ORDER — PROPOFOL 10 MG/ML IV EMUL
INTRAVENOUS | Status: DC | PRN
  Administered 2012-01-17: 100 mg via INTRAVENOUS
  Administered 2012-01-17: 20 mg via INTRAVENOUS
  Administered 2012-01-17: 180 mg via INTRAVENOUS

## 2012-01-17 MED ORDER — SODIUM CHLORIDE 0.9 % IJ SOLN
INTRAMUSCULAR | Status: DC | PRN
  Administered 2012-01-17: 50 mL

## 2012-01-17 MED ORDER — LEVOTHYROXINE SODIUM 125 MCG PO TABS
125.0000 ug | ORAL_TABLET | Freq: Every day | ORAL | Status: DC
  Administered 2012-01-18: 125 ug via ORAL
  Filled 2012-01-17: qty 1

## 2012-01-17 MED ORDER — HYDROMORPHONE HCL PF 1 MG/ML IJ SOLN
0.2500 mg | INTRAMUSCULAR | Status: DC | PRN
  Administered 2012-01-17: 0.5 mg via INTRAVENOUS

## 2012-01-17 MED ORDER — BUPIVACAINE-EPINEPHRINE PF 0.5-1:200000 % IJ SOLN
INTRAMUSCULAR | Status: DC | PRN
  Administered 2012-01-17: 10 mL

## 2012-01-17 MED ORDER — DEXTROSE-NACL 5-0.45 % IV SOLN: INTRAVENOUS | Status: DC

## 2012-01-17 MED ORDER — DOCUSATE SODIUM 100 MG PO CAPS
100.0000 mg | ORAL_CAPSULE | Freq: Two times a day (BID) | ORAL | Status: DC
  Administered 2012-01-17: 100 mg via ORAL
  Filled 2012-01-17: qty 1

## 2012-01-17 MED ORDER — KETOROLAC TROMETHAMINE 30 MG/ML IJ SOLN: 15.0000 mg | Freq: Once | INTRAMUSCULAR | Status: DC | PRN

## 2012-01-17 MED ORDER — LIDOCAINE HCL (CARDIAC) 20 MG/ML IV SOLN
INTRAVENOUS | Status: AC
  Filled 2012-01-17: qty 5

## 2012-01-17 MED ORDER — FENTANYL CITRATE 0.05 MG/ML IJ SOLN
INTRAMUSCULAR | Status: AC
  Filled 2012-01-17: qty 5

## 2012-01-17 MED ORDER — LACTATED RINGERS IV SOLN
INTRAVENOUS | Status: DC
  Administered 2012-01-17 (×3): via INTRAVENOUS

## 2012-01-17 MED ORDER — DEXTROSE 5 % IV SOLN
2.0000 g | INTRAVENOUS | Status: AC
  Administered 2012-01-17: 2 g via INTRAVENOUS
  Filled 2012-01-17: qty 2

## 2012-01-17 MED ORDER — BUPIVACAINE-EPINEPHRINE (PF) 0.5% -1:200000 IJ SOLN
INTRAMUSCULAR | Status: AC
  Filled 2012-01-17: qty 20

## 2012-01-17 MED ORDER — INDIGOTINDISULFONATE SODIUM 8 MG/ML IJ SOLN
INTRAMUSCULAR | Status: DC | PRN
  Administered 2012-01-17: 40 mg via INTRAVENOUS

## 2012-01-17 MED ORDER — METOCLOPRAMIDE HCL 5 MG/ML IJ SOLN: 10.0000 mg | Freq: Once | INTRAMUSCULAR | Status: DC | PRN

## 2012-01-17 MED ORDER — SCOPOLAMINE 1 MG/3DAYS TD PT72
1.0000 | MEDICATED_PATCH | Freq: Once | TRANSDERMAL | Status: DC
  Administered 2012-01-17: 1.5 mg via TRANSDERMAL

## 2012-01-17 MED ORDER — BUPROPION HCL ER (SR) 150 MG PO TB12
150.0000 mg | ORAL_TABLET | Freq: Every day | ORAL | Status: DC
  Filled 2012-01-17 (×2): qty 1

## 2012-01-17 MED ORDER — HYDROMORPHONE HCL PF 1 MG/ML IJ SOLN
INTRAMUSCULAR | Status: AC
  Administered 2012-01-17: 0.5 mg via INTRAVENOUS
  Filled 2012-01-17: qty 1

## 2012-01-17 MED ORDER — MIDAZOLAM HCL 5 MG/5ML IJ SOLN
INTRAMUSCULAR | Status: DC | PRN
  Administered 2012-01-17: 2 mg via INTRAVENOUS

## 2012-01-17 MED ORDER — FENTANYL CITRATE 0.05 MG/ML IJ SOLN
INTRAMUSCULAR | Status: DC | PRN
  Administered 2012-01-17: 25 ug via INTRAVENOUS
  Administered 2012-01-17: 100 ug via INTRAVENOUS

## 2012-01-17 MED ORDER — STERILE WATER FOR IRRIGATION IR SOLN
Status: DC | PRN
  Administered 2012-01-17: 1000 mL via INTRAVESICAL
  Administered 2012-01-17: 3000 mL via INTRAVESICAL

## 2012-01-17 MED ORDER — ONDANSETRON HCL 4 MG/2ML IJ SOLN
INTRAMUSCULAR | Status: DC | PRN
  Administered 2012-01-17: 4 mg via INTRAVENOUS

## 2012-01-17 MED ORDER — OXYCODONE-ACETAMINOPHEN 5-325 MG PO TABS
1.0000 | ORAL_TABLET | ORAL | Status: DC | PRN
  Administered 2012-01-17 (×2): 2 via ORAL
  Filled 2012-01-17 (×2): qty 2

## 2012-01-17 MED ORDER — HYDROMORPHONE HCL PF 1 MG/ML IJ SOLN: 0.2000 mg | INTRAMUSCULAR | Status: DC | PRN

## 2012-01-17 MED ORDER — LIDOCAINE-EPINEPHRINE 1 %-1:100000 IJ SOLN
INTRAMUSCULAR | Status: DC | PRN
  Administered 2012-01-17: 20 mL

## 2012-01-17 MED ORDER — LACTATED RINGERS IV SOLN: INTRAVENOUS | Status: DC

## 2012-01-17 MED ORDER — LIDOCAINE HCL (CARDIAC) 20 MG/ML IV SOLN
INTRAVENOUS | Status: DC | PRN
  Administered 2012-01-17: 50 mg via INTRAVENOUS

## 2012-01-17 MED ORDER — BUPIVACAINE-EPINEPHRINE (PF) 0.5% -1:200000 IJ SOLN
INTRAMUSCULAR | Status: AC
  Filled 2012-01-17: qty 10

## 2012-01-17 MED ORDER — ONDANSETRON HCL 4 MG/2ML IJ SOLN
INTRAMUSCULAR | Status: AC
  Filled 2012-01-17: qty 2

## 2012-01-17 MED ORDER — SCOPOLAMINE 1 MG/3DAYS TD PT72
MEDICATED_PATCH | TRANSDERMAL | Status: AC
  Administered 2012-01-17: 1.5 mg via TRANSDERMAL
  Filled 2012-01-17: qty 1

## 2012-01-17 SURGICAL SUPPLY — 28 items
ADH SKN CLS APL DERMABOND .7 (GAUZE/BANDAGES/DRESSINGS) ×1
CANISTER SUCTION 2500CC (MISCELLANEOUS) ×2 IMPLANT
CATH FOLEY 2WAY SLVR  5CC 18FR (CATHETERS) ×1
CATH FOLEY 2WAY SLVR 5CC 18FR (CATHETERS) ×1 IMPLANT
CATH ROBINSON RED A/P 16FR (CATHETERS) IMPLANT
CLOTH BEACON ORANGE TIMEOUT ST (SAFETY) ×2 IMPLANT
DECANTER SPIKE VIAL GLASS SM (MISCELLANEOUS) IMPLANT
DERMABOND ADVANCED (GAUZE/BANDAGES/DRESSINGS) ×1
DERMABOND ADVANCED .7 DNX12 (GAUZE/BANDAGES/DRESSINGS) IMPLANT
DRAPE HYSTEROSCOPY (DRAPE) ×2 IMPLANT
GAUZE PACKING 2X5 YD STERILE (GAUZE/BANDAGES/DRESSINGS) IMPLANT
GAUZE PACKING IODOFORM 2 (PACKING) ×1 IMPLANT
GAUZE SPONGE 4X4 16PLY XRAY LF (GAUZE/BANDAGES/DRESSINGS) ×1 IMPLANT
GLOVE ECLIPSE 7.0 STRL STRAW (GLOVE) ×4 IMPLANT
GOWN PREVENTION PLUS LG XLONG (DISPOSABLE) ×4 IMPLANT
GOWN STRL REIN XL XLG (GOWN DISPOSABLE) ×2 IMPLANT
NDL SPNL 18GX3.5 QUINCKE PK (NEEDLE) IMPLANT
NEEDLE SPNL 18GX3.5 QUINCKE PK (NEEDLE) IMPLANT
NS IRRIG 1000ML POUR BTL (IV SOLUTION) ×2 IMPLANT
PACK VAGINAL WOMENS (CUSTOM PROCEDURE TRAY) ×2 IMPLANT
SET CYSTO W/LG BORE CLAMP LF (SET/KITS/TRAYS/PACK) ×2 IMPLANT
SUT CHROMIC 3 0 SH 27 (SUTURE) ×1 IMPLANT
SUT VIC AB 2-0 CTB1 (SUTURE) ×3 IMPLANT
SYR 20CC LL (SYRINGE) ×2 IMPLANT
TOWEL OR 17X24 6PK STRL BLUE (TOWEL DISPOSABLE) ×4 IMPLANT
TRAY FOLEY CATH 14FR (SET/KITS/TRAYS/PACK) ×1 IMPLANT
WATER STERILE IRR 1000ML POUR (IV SOLUTION) ×2 IMPLANT
tvt exact ×1 IMPLANT

## 2012-01-17 NOTE — H&P (Signed)
Pt is a 51 year old white female who presents to the OR for a TVT urethral sling to correct classic stress urinary incontinence.  Pt has no symptoms of urge incontinence. Pt also has a hemorrhoid that she would like removed. Pt expressed her understanding of the procedure including the risks and possible complications. PE:  VSSAF         HEENT- wnl         ABD-obese, non tender, no masses         Pelvic- ext-wnl          Vagina- without discharge                     No pelvic masses. IMP/ SUI          Anal hemorrhoid Plan/ TVT           Excision of hemorhoid.

## 2012-01-17 NOTE — Addendum Note (Signed)
Addendum  created 01/17/12 1800 by Buster Schueller S Kip Cropp, CRNA   Modules edited:Notes Section    

## 2012-01-17 NOTE — Anesthesia Postprocedure Evaluation (Signed)
  Anesthesia Post-op Note  Patient: Alexis Knight  Procedure(s) Performed: Procedure(s) (LRB) with comments: TRANSVAGINAL TAPE (TVT) PROCEDURE (N/A) - with cystoscopy and excision of hemmorhoid  Patient Location: PACU and Women's Unit  Anesthesia Type: General  Level of Consciousness: awake, alert  and oriented  Airway and Oxygen Therapy: Patient Spontanous Breathing  Post-op Pain: mild  Post-op Assessment: Patient's Cardiovascular Status Stable, Respiratory Function Stable, No signs of Nausea or vomiting, Adequate PO intake and Pain level controlled  Post-op Vital Signs: stable  Complications: No apparent anesthesia complications

## 2012-01-17 NOTE — Addendum Note (Signed)
Addendum  created 01/17/12 1034 by Algis Greenhouse, CRNA   Modules edited:Charges VN

## 2012-01-17 NOTE — Anesthesia Preprocedure Evaluation (Signed)
Anesthesia Evaluation  Patient identified by MRN, date of birth, ID band Patient awake    Reviewed: Allergy & Precautions, H&P , NPO status , Patient's Chart, lab work & pertinent test results, reviewed documented beta blocker date and time   History of Anesthesia Complications (+) PONV  Airway Mallampati: III TM Distance: >3 FB Neck ROM: full    Dental  (+) Teeth Intact   Pulmonary neg pulmonary ROS,  breath sounds clear to auscultation  Pulmonary exam normal       Cardiovascular Exercise Tolerance: Good negative cardio ROS  Rhythm:regular Rate:Normal     Neuro/Psych  Headaches, PSYCHIATRIC DISORDERS (anxiety)    GI/Hepatic negative GI ROS, Neg liver ROS,   Endo/Other  Hypothyroidism Morbid obesity  Renal/GU negative Renal ROS  Female GU complaint     Musculoskeletal   Abdominal   Peds  Hematology negative hematology ROS (+)   Anesthesia Other Findings   Reproductive/Obstetrics negative OB ROS                           Anesthesia Physical Anesthesia Plan  ASA: III  Anesthesia Plan: General LMA   Post-op Pain Management:    Induction:   Airway Management Planned:   Additional Equipment:   Intra-op Plan:   Post-operative Plan:   Informed Consent: I have reviewed the patients History and Physical, chart, labs and discussed the procedure including the risks, benefits and alternatives for the proposed anesthesia with the patient or authorized representative who has indicated his/her understanding and acceptance.   Dental Advisory Given  Plan Discussed with: CRNA and Surgeon  Anesthesia Plan Comments:         Anesthesia Quick Evaluation

## 2012-01-17 NOTE — Transfer of Care (Signed)
Immediate Anesthesia Transfer of Care Note  Patient: Alexis Knight  Procedure(s) Performed: Procedure(s) (LRB) with comments: TRANSVAGINAL TAPE (TVT) PROCEDURE (N/A) - with cystoscopy and excision of hemmorhoid  Patient Location: PACU  Anesthesia Type: General  Level of Consciousness: sedated  Airway & Oxygen Therapy: Patient Spontanous Breathing and Patient connected to nasal cannula oxygen  Post-op Assessment: Report given to PACU RN  Post vital signs: Reviewed and stable  Complications: No apparent anesthesia complications

## 2012-01-17 NOTE — Anesthesia Postprocedure Evaluation (Signed)
Anesthesia Post Note  Patient: Alexis Knight  Procedure(s) Performed: Procedure(s) (LRB): TRANSVAGINAL TAPE (TVT) PROCEDURE (N/A)  Anesthesia type: General  Patient location: PACU  Post pain: Pain level controlled  Post assessment: Post-op Vital signs reviewed  Last Vitals:  Filed Vitals:   01/17/12 0905  BP: 129/70  Pulse: 106  Temp: 37.1 C  Resp: 20    Post vital signs: Reviewed  Level of consciousness: sedated  Complications: No apparent anesthesia complicationsfj

## 2012-01-18 ENCOUNTER — Encounter: Payer: Self-pay | Admitting: Diagnostic Radiology

## 2012-01-18 ENCOUNTER — Encounter (HOSPITAL_COMMUNITY): Payer: Self-pay | Admitting: Obstetrics and Gynecology

## 2012-01-18 MED ORDER — BUPROPION HCL ER (SR) 150 MG PO TB12: 150.0000 mg | ORAL_TABLET | Freq: Every day | ORAL | Status: DC

## 2012-01-18 MED ORDER — INFLUENZA VIRUS VACC SPLIT PF IM SUSP
0.5000 mL | Freq: Once | INTRAMUSCULAR | Status: AC
  Administered 2012-01-18: 0.5 mL via INTRAMUSCULAR
  Filled 2012-01-18: qty 0.5

## 2012-01-18 NOTE — Progress Notes (Signed)
Ur chart review completed.  

## 2012-01-18 NOTE — Op Note (Signed)
Alexis Knight, Alexis Knight                 ACCOUNT NO.:  [id number]  MEDICAL RECORD NO.:  [id number]  LOCATION:  9317                          FACILITY:  [facility]  PHYSICIAN:  Malva Limes, M.D.    DATE OF BIRTH:  May 19, 1960  DATE OF PROCEDURE:  01/17/2012 DATE OF DISCHARGE:  01/18/2012                              OPERATIVE REPORT   PREOPERATIVE DIAGNOSIS: 1. Stress urinary incontinence 2. Symptomatic anal hemorrhoid.  POSTOPERATIVE DIAGNOSIS: 1. Stress urinary incontinence. 2. Symptomatic anal hemorrhoid.  PROCEDURE: 1. Transurethropexy with TVT Exact. 2. Cystoscopy 3. Excision of hernia.  SURGEON:  Malva Limes, MD  ANESTHESIA:  General and local.  ANTIBIOTICS:  Cefotetan 1 g.  DRAINS:  Foley bedside drainage.  ESTIMATED BLOOD LOSS:  150 mL.  PROCEDURE:  The patient was taken to the operating room.  She was placed in a dorsal supine position.  A general anesthetic was administered without difficulty.  She was then placed in dorsal lithotomy position. She was prepped and draped in the usual fashion for this procedure.  The patient had an exam under anesthesia, which revealed no pelvic masses. At this point, a Foley catheter was placed and her bladder was drained. Initially the midline of her abdomen was identified and marked along with exit sites for the transurethral sling.  Following this, the retropubic space was injected with a dilute 1% lidocaine solution on the left and right.The transurethral spaces on the anterior vagina were injected with the same dilute solution.  At this point, a vertical  incision was made approximately 1 cm from the urethral meatus on the anterior vaginal wall.  The periurethral space was dissected with the Metzenbaum scissors on the left and right.  At this point, the TVT Exact device was opened and a Foley with the manipulator rod placed through it was placed in the bladder.  The bladder was deviated to the patient's left and the transurethral  tape was passed through the rectropubic space without difficulty on the right.  A similar procedure was performed on the opposite side.  At this point, cystoscopy was performed.  During the cystoscopy, the patient had no evidence of foreign bodies in the bladder cavity.  Both ureters were seen to be functioning.  The patient had been given indigo carmine.  The urethra opening appeared to be normal.  As the cystoscope was removed, there was concern that there may have been an injury to the most proximal portion of the urethra.  While in the operative suite, this initially was felt to be the cystoscope falling out of the urethral meatus, however, after several attempts to determine that, it was felt that she may have an injury to her left inferior urethra initially.  Because of this, a small area that appeared to possibly have the injury was oversewn with 3-0 chromic suture.  Two layers were used.  After the procedure was concluded and the patient taken to the recovery room, it was felt that most likely this was an incorrect diagnosis.  I felt that what had happened was that I did not account for the 70 degree scope as I was pulling the scope out, thinking that the lens was  still in the urethra and most likely it was outside the urethra and that is why I was seeing the TVT Sheath.   I truly believe that the patient did not have an injury to urethra and that in fact it was my mistake not accounting for the 70 degree angle.  At no time was there any evidence of any foreign material going through the urethra.  After this area was oversewn, multiple attempts to find any defect was not seen and at that point the sling was pulled up through the retropubic space.  The plastic covering removed and the excess material cut off.  The patient did have a Kelly forceps placed between the urethra and the sling when this was being performed.  The placement of the sling appeared to be a cm or 2 away from the  area of our concern and sutured area.  The area that was sutured was approximately 1 cm from the urethral opening.  At this point, the anterior vaginal incision was closed using 2-0 Vicryl in a running, locking fashion and the vagina packed with 2 inch iodoform. Following this, the hemorrhoid which was at 7 o'clock on the patient's anus was injected with 0.25% Marcaine with epinephrine and excised in an elliptical fashion.  The area was then closed using 2-0 chromic in a running fashion.  There was no bleeding noted at conclusion of the procedure.  At that point, the patient was awakened and taken to recovery room.          ______________________________ Malva Limes, M.D.     MA/MEDQ  D:  01/18/2012  T:  01/18/2012  Job:  [id number]

## 2012-01-18 NOTE — Progress Notes (Signed)
POD#1 Pt states that she is doing well. Has only used one percocet. VSSAF Abd-soft, non tender, incisions-wnl No vaginal bleeding. Imp/ Stable Plan/ Will discharge with a leg bag and return to the office on Tuesday for cath removal and voiding trial.           Percocet, Colace, Keflex           Call with any pain, vaginal bleeding, fever, anal bleeding

## 2012-11-14 ENCOUNTER — Other Ambulatory Visit: Payer: Self-pay | Admitting: Internal Medicine

## 2012-11-14 DIAGNOSIS — E049 Nontoxic goiter, unspecified: Secondary | ICD-10-CM

## 2012-11-15 ENCOUNTER — Ambulatory Visit
Admission: RE | Admit: 2012-11-15 | Discharge: 2012-11-15 | Disposition: A | Discharge status: Home / Self Care | Payer: Federal, State, Local not specified - PPO | Source: Ambulatory Visit | Attending: Internal Medicine | Admitting: Internal Medicine

## 2012-11-15 DIAGNOSIS — E049 Nontoxic goiter, unspecified: Secondary | ICD-10-CM

## 2014-11-25 ENCOUNTER — Other Ambulatory Visit: Payer: Self-pay | Admitting: Obstetrics and Gynecology

## 2014-11-26 LAB — CYTOLOGY - PAP

## 2015-02-16 ENCOUNTER — Ambulatory Visit
Admission: RE | Admit: 2015-02-16 | Discharge: 2015-02-16 | Disposition: A | Discharge status: Home / Self Care | Payer: Federal, State, Local not specified - PPO | Source: Ambulatory Visit | Attending: Internal Medicine | Admitting: Internal Medicine

## 2015-02-16 ENCOUNTER — Other Ambulatory Visit: Payer: Self-pay | Admitting: Internal Medicine

## 2015-02-16 DIAGNOSIS — R1030 Lower abdominal pain, unspecified: Secondary | ICD-10-CM

## 2015-02-16 MED ORDER — IOPAMIDOL (ISOVUE-300) INJECTION 61%
100.0000 mL | Freq: Once | INTRAVENOUS | Status: AC | PRN
  Administered 2015-02-16: 100 mL via INTRAVENOUS

## 2015-06-02 DIAGNOSIS — M542 Cervicalgia: Secondary | ICD-10-CM

## 2015-06-02 DIAGNOSIS — R591 Generalized enlarged lymph nodes: Secondary | ICD-10-CM | POA: Insufficient documentation

## 2015-06-02 DIAGNOSIS — M25519 Pain in unspecified shoulder: Secondary | ICD-10-CM | POA: Insufficient documentation

## 2015-06-03 ENCOUNTER — Other Ambulatory Visit: Payer: Self-pay | Admitting: Otolaryngology

## 2015-06-03 DIAGNOSIS — M542 Cervicalgia: Secondary | ICD-10-CM

## 2015-06-03 DIAGNOSIS — M25519 Pain in unspecified shoulder: Secondary | ICD-10-CM

## 2015-06-03 DIAGNOSIS — R591 Generalized enlarged lymph nodes: Secondary | ICD-10-CM

## 2015-06-09 ENCOUNTER — Ambulatory Visit
Admission: RE | Admit: 2015-06-09 | Discharge: 2015-06-09 | Disposition: A | Discharge status: Home / Self Care | Payer: Federal, State, Local not specified - PPO | Source: Ambulatory Visit | Attending: Otolaryngology | Admitting: Otolaryngology

## 2015-06-09 DIAGNOSIS — M25519 Pain in unspecified shoulder: Secondary | ICD-10-CM

## 2015-06-09 DIAGNOSIS — R591 Generalized enlarged lymph nodes: Secondary | ICD-10-CM

## 2015-06-09 DIAGNOSIS — M542 Cervicalgia: Secondary | ICD-10-CM

## 2015-06-09 MED ORDER — IOPAMIDOL (ISOVUE-300) INJECTION 61%
75.0000 mL | Freq: Once | INTRAVENOUS | Status: AC | PRN
  Administered 2015-06-09: 75 mL via INTRAVENOUS

## 2015-07-01 DIAGNOSIS — J302 Other seasonal allergic rhinitis: Secondary | ICD-10-CM | POA: Insufficient documentation

## 2016-04-27 DIAGNOSIS — M7631 Iliotibial band syndrome, right leg: Secondary | ICD-10-CM | POA: Diagnosis not present

## 2016-05-05 DIAGNOSIS — M7631 Iliotibial band syndrome, right leg: Secondary | ICD-10-CM | POA: Diagnosis not present

## 2016-06-07 DIAGNOSIS — K589 Irritable bowel syndrome without diarrhea: Secondary | ICD-10-CM | POA: Diagnosis not present

## 2016-06-07 DIAGNOSIS — Z808 Family history of malignant neoplasm of other organs or systems: Secondary | ICD-10-CM | POA: Diagnosis not present

## 2016-06-07 DIAGNOSIS — E042 Nontoxic multinodular goiter: Secondary | ICD-10-CM | POA: Diagnosis not present

## 2016-06-07 DIAGNOSIS — F419 Anxiety disorder, unspecified: Secondary | ICD-10-CM | POA: Diagnosis not present

## 2016-06-08 DIAGNOSIS — Z1231 Encounter for screening mammogram for malignant neoplasm of breast: Secondary | ICD-10-CM | POA: Diagnosis not present

## 2016-06-08 DIAGNOSIS — Z6841 Body Mass Index (BMI) 40.0 and over, adult: Secondary | ICD-10-CM | POA: Diagnosis not present

## 2016-06-08 DIAGNOSIS — Z01419 Encounter for gynecological examination (general) (routine) without abnormal findings: Secondary | ICD-10-CM | POA: Diagnosis not present

## 2016-06-08 DIAGNOSIS — N904 Leukoplakia of vulva: Secondary | ICD-10-CM | POA: Insufficient documentation

## 2016-07-11 DIAGNOSIS — M542 Cervicalgia: Secondary | ICD-10-CM | POA: Diagnosis not present

## 2016-07-21 DIAGNOSIS — M542 Cervicalgia: Secondary | ICD-10-CM | POA: Diagnosis not present

## 2016-07-28 DIAGNOSIS — M542 Cervicalgia: Secondary | ICD-10-CM | POA: Diagnosis not present

## 2016-08-09 DIAGNOSIS — L719 Rosacea, unspecified: Secondary | ICD-10-CM | POA: Diagnosis not present

## 2016-08-09 DIAGNOSIS — L820 Inflamed seborrheic keratosis: Secondary | ICD-10-CM | POA: Diagnosis not present

## 2016-08-17 DIAGNOSIS — M542 Cervicalgia: Secondary | ICD-10-CM | POA: Diagnosis not present

## 2016-08-31 DIAGNOSIS — M542 Cervicalgia: Secondary | ICD-10-CM | POA: Diagnosis not present

## 2016-09-05 DIAGNOSIS — M542 Cervicalgia: Secondary | ICD-10-CM | POA: Diagnosis not present

## 2016-09-05 DIAGNOSIS — M50323 Other cervical disc degeneration at C6-C7 level: Secondary | ICD-10-CM | POA: Diagnosis not present

## 2016-09-19 DIAGNOSIS — M50323 Other cervical disc degeneration at C6-C7 level: Secondary | ICD-10-CM | POA: Diagnosis not present

## 2016-09-19 DIAGNOSIS — M542 Cervicalgia: Secondary | ICD-10-CM | POA: Diagnosis not present

## 2016-09-23 DIAGNOSIS — M542 Cervicalgia: Secondary | ICD-10-CM | POA: Diagnosis not present

## 2016-09-23 DIAGNOSIS — M50323 Other cervical disc degeneration at C6-C7 level: Secondary | ICD-10-CM | POA: Diagnosis not present

## 2016-10-10 ENCOUNTER — Ambulatory Visit: Payer: Self-pay | Admitting: Physician Assistant

## 2016-11-17 DIAGNOSIS — M542 Cervicalgia: Secondary | ICD-10-CM | POA: Diagnosis not present

## 2016-12-20 DIAGNOSIS — M50323 Other cervical disc degeneration at C6-C7 level: Secondary | ICD-10-CM | POA: Diagnosis not present

## 2016-12-23 ENCOUNTER — Encounter (HOSPITAL_COMMUNITY): Payer: Self-pay

## 2016-12-23 ENCOUNTER — Encounter (HOSPITAL_COMMUNITY)
Admission: RE | Admit: 2016-12-23 | Discharge: 2016-12-23 | Disposition: A | Discharge status: Home / Self Care | Payer: Federal, State, Local not specified - PPO | Source: Ambulatory Visit | Attending: Orthopedic Surgery | Admitting: Orthopedic Surgery

## 2016-12-23 DIAGNOSIS — M4722 Other spondylosis with radiculopathy, cervical region: Secondary | ICD-10-CM | POA: Insufficient documentation

## 2016-12-23 DIAGNOSIS — Z01812 Encounter for preprocedural laboratory examination: Secondary | ICD-10-CM | POA: Diagnosis not present

## 2016-12-23 LAB — CBC
HCT: 39.2 % (ref 36.0–46.0)
Hemoglobin: 12.3 g/dL (ref 12.0–15.0)
MCH: 26.1 pg (ref 26.0–34.0)
MCHC: 31.4 g/dL (ref 30.0–36.0)
MCV: 83.2 fL (ref 78.0–100.0)
Platelets: 313 10*3/uL (ref 150–400)
RBC: 4.71 MIL/uL (ref 3.87–5.11)
RDW: 14 % (ref 11.5–15.5)
WBC: 7.5 10*3/uL (ref 4.0–10.5)

## 2016-12-23 LAB — BASIC METABOLIC PANEL
Anion gap: 7 (ref 5–15)
BUN: 13 mg/dL (ref 6–20)
CO2: 27 mmol/L (ref 22–32)
Calcium: 9.1 mg/dL (ref 8.9–10.3)
Chloride: 105 mmol/L (ref 101–111)
Creatinine, Ser: 0.64 mg/dL (ref 0.44–1.00)
GFR calc Af Amer: >60 mL/min (ref >60)
GFR calc non Af Amer: >60 mL/min (ref >60)
Glucose, Bld: 128 mg/dL — ABNORMAL HIGH (ref 65–99)
Potassium: 3.8 mmol/L (ref 3.5–5.1)
Sodium: 139 mmol/L (ref 135–145)

## 2016-12-23 LAB — SURGICAL PCR SCREEN
MRSA, PCR: NEGATIVE
Staphylococcus aureus: NEGATIVE

## 2016-12-23 NOTE — Pre-Procedure Instructions (Addendum)
Alexis Knight  12/23/2016      [facility] - [place], [place] - [address] [name] [address] Phone: [phone] Fax: [phone]    Your procedure is scheduled on 12/29/16.  Report to [facility] Admitting at 530 A.M.  Call this number if you have problems the morning of surgery:  [phone]   Remember:  Do not eat food or drink liquids after midnight.  Take these medicines the morning of surgery with A SIP OF WATER     Levothyroxine(synthroid),  STOP all herbel meds, nsaids (aleve,naproxen,advil,ibuprofen) prior to surgery starting today 12/23/16 including all vitamins/supplements,aspirin, tumeric,fish oil, goodys   Do not wear jewelry, make-up or nail polish.  Do not wear lotions, powders, or perfumes, or deoderant.  Do not shave 48 hours prior to surgery.  Men may shave face and neck.  Do not bring valuables to the hospital.  [facility] is not responsible for any belongings or valuables.  Contacts, dentures or bridgework may not be worn into surgery.  Leave your suitcase in the car.  After surgery it may be brought to your room.  For patients admitted to the hospital, discharge time will be determined by your treatment team.  Patients discharged the day of surgery will not be allowed to drive home.   Special instructions:   Special Instructions: [place] - Preparing for Surgery  Before surgery, you can play an important role.  Because skin is not sterile, your skin needs to be as free of germs as possible.  You can reduce the number of germs on you skin by washing with CHG (chlorahexidine gluconate) soap before surgery.  CHG is an antiseptic cleaner which kills germs and bonds with the skin to continue killing germs even after washing.  Please DO NOT use if you have an allergy to CHG or antibacterial soaps.  If your skin becomes reddened/irritated stop using the CHG and inform your nurse when you arrive  at Short Stay.  Do not shave (including legs and underarms) for at least 48 hours prior to the first CHG shower.  You may shave your face.  Please follow these instructions carefully:   1.  Shower with CHG Soap the night before surgery and the morning of Surgery.  2.  If you choose to wash your hair, wash your hair first as usual with your normal shampoo.  3.  After you shampoo, rinse your hair and body thoroughly to remove the Shampoo.  4.  Use CHG as you would any other liquid soap.  You can apply chg directly  to the skin and wash gently with scrungie or a clean washcloth.  5.  Apply the CHG Soap to your body ONLY FROM THE NECK DOWN.  Do not use on open wounds or open sores.  Avoid contact with your eyes ears, mouth and genitals (private parts).  Wash genitals (private parts)       with your normal soap.  6.  Wash thoroughly, paying special attention to the area where your surgery will be performed.  7.  Thoroughly rinse your body with warm water from the neck down.  8.  DO NOT shower/wash with your normal soap after using and rinsing off the CHG Soap.  9.  Pat yourself dry with a clean towel.            10.  Wear clean pajamas.  11.  Place clean sheets on your bed the night of your first shower and do not sleep with pets.  Day of Surgery  Do not apply any lotions/deodorants the morning of surgery.  Please wear clean clothes to the hospital/surgery center.  Please read over the fact sheets that you were given.

## 2016-12-26 DIAGNOSIS — H6690 Otitis media, unspecified, unspecified ear: Secondary | ICD-10-CM | POA: Diagnosis not present

## 2016-12-26 DIAGNOSIS — J209 Acute bronchitis, unspecified: Secondary | ICD-10-CM | POA: Diagnosis not present

## 2016-12-29 ENCOUNTER — Encounter (HOSPITAL_COMMUNITY): Admission: RE | Payer: Self-pay | Source: Ambulatory Visit

## 2016-12-29 ENCOUNTER — Ambulatory Visit (HOSPITAL_COMMUNITY)
Admission: RE | Admit: 2016-12-29 | Payer: Federal, State, Local not specified - PPO | Source: Ambulatory Visit | Admitting: Orthopedic Surgery

## 2016-12-29 SURGERY — ANTERIOR CERVICAL DECOMPRESSION/DISCECTOMY FUSION 1 LEVEL
Anesthesia: General

## 2017-01-05 ENCOUNTER — Ambulatory Visit: Payer: Self-pay | Admitting: Physician Assistant

## 2017-01-06 ENCOUNTER — Ambulatory Visit: Payer: Self-pay | Admitting: Physician Assistant

## 2017-01-11 DIAGNOSIS — M50323 Other cervical disc degeneration at C6-C7 level: Secondary | ICD-10-CM | POA: Diagnosis not present

## 2017-01-25 ENCOUNTER — Encounter (HOSPITAL_COMMUNITY)
Admission: RE | Admit: 2017-01-25 | Discharge: 2017-01-25 | Disposition: A | Discharge status: Home / Self Care | Payer: Federal, State, Local not specified - PPO | Source: Ambulatory Visit | Attending: Orthopedic Surgery | Admitting: Orthopedic Surgery

## 2017-01-25 ENCOUNTER — Encounter (HOSPITAL_COMMUNITY): Payer: Self-pay

## 2017-01-25 DIAGNOSIS — M4722 Other spondylosis with radiculopathy, cervical region: Secondary | ICD-10-CM | POA: Insufficient documentation

## 2017-01-25 LAB — BASIC METABOLIC PANEL
Anion gap: 9 (ref 5–15)
BUN: 8 mg/dL (ref 6–20)
CO2: 25 mmol/L (ref 22–32)
Calcium: 8.9 mg/dL (ref 8.9–10.3)
Chloride: 104 mmol/L (ref 101–111)
Creatinine, Ser: 0.62 mg/dL (ref 0.44–1.00)
GFR calc Af Amer: >60 mL/min (ref >60)
GFR calc non Af Amer: >60 mL/min (ref >60)
Glucose, Bld: 111 mg/dL — ABNORMAL HIGH (ref 65–99)
Potassium: 3.5 mmol/L (ref 3.5–5.1)
Sodium: 138 mmol/L (ref 135–145)

## 2017-01-25 LAB — CBC
HCT: 38.9 % (ref 36.0–46.0)
Hemoglobin: 12.5 g/dL (ref 12.0–15.0)
MCH: 26.3 pg (ref 26.0–34.0)
MCHC: 32.1 g/dL (ref 30.0–36.0)
MCV: 81.9 fL (ref 78.0–100.0)
Platelets: 339 10*3/uL (ref 150–400)
RBC: 4.75 MIL/uL (ref 3.87–5.11)
RDW: 14.5 % (ref 11.5–15.5)
WBC: 9.5 10*3/uL (ref 4.0–10.5)

## 2017-01-25 LAB — SURGICAL PCR SCREEN
MRSA, PCR: NEGATIVE
Staphylococcus aureus: NEGATIVE

## 2017-01-25 NOTE — Progress Notes (Addendum)
XYO:FVWAQ, Herbie Baltimore, MD  Cardiologist: pt denies  EKG: pt denies past year  Stress test: pt denies ever  ECHO: pt denies ever  Cardiac Cath: pt denies ever  Chest x-ray: pt denies past year

## 2017-01-25 NOTE — Pre-Procedure Instructions (Signed)
Alexis Knight  01/25/2017      [facility] - [place], [place] - [address] Alexis Knight Alexis Knight [address] Phone: [phone] Fax: [phone]    Your procedure is scheduled on February 02, 2017.  Report to [facility] Admitting at 530 AM.  Call this number if you have problems the morning of surgery:  [phone]   Remember:  Do not eat food or drink liquids after midnight.  Take these medicines the morning of surgery with A SIP OF WATER cetirizine (zyrtec), levothyroxine (synthroid).   7 days prior to surgery STOP taking any Aspirin (unless otherwise instructed by your surgeon), Aleve, Naproxen, Ibuprofen, Motrin, Advil, Goody's, BC's, all herbal medications, fish oil, and all vitamins  Continue all other medications as instructed by your physician except follow the above medication instructions before surgery   Do not wear jewelry, make-up or nail polish.  Do not wear lotions, powders, or perfumes, or deoderant.  Do not shave 48 hours prior to surgery.    Do not bring valuables to the hospital.  [facility] is not responsible for any belongings or valuables.  Contacts, dentures or bridgework may not be worn into surgery.  Leave your suitcase in the car.  After surgery it may be brought to your room.  For patients admitted to the hospital, discharge time will be determined by your treatment team.  Patients discharged the day of surgery will not be allowed to drive home.   Special instructions:   [place]- Preparing For Surgery  Before surgery, you can play an important role. Because skin is not sterile, your skin needs to be as free of germs as possible. You can reduce the number of germs on your skin by washing with CHG (chlorahexidine gluconate) Soap before surgery.  CHG is an antiseptic cleaner which kills germs and bonds with the skin to continue killing germs even after washing.  Please do not use if you have an  allergy to CHG or antibacterial soaps. If your skin becomes reddened/irritated stop using the CHG.  Do not shave (including legs and underarms) for at least 48 hours prior to first CHG shower. It is OK to shave your face.  Please follow these instructions carefully.   1. Shower the NIGHT BEFORE SURGERY and the MORNING OF SURGERY with CHG.   2. If you chose to wash your hair, wash your hair first as usual with your normal shampoo.  3. After you shampoo, rinse your hair and body thoroughly to remove the shampoo.  4. Use CHG as you would any other liquid soap. You can apply CHG directly to the skin and wash gently with a scrungie or a clean washcloth.   5. Apply the CHG Soap to your body ONLY FROM THE NECK DOWN.  Do not use on open wounds or open sores. Avoid contact with your eyes, ears, mouth and genitals (private parts). Wash Face and genitals (private parts)  with your normal soap.  6. Wash thoroughly, paying special attention to the area where your surgery will be performed.  7. Thoroughly rinse your body with warm water from the neck down.  8. DO NOT shower/wash with your normal soap after using and rinsing off the CHG Soap.  9. Pat yourself dry with a CLEAN TOWEL.  10. Wear CLEAN PAJAMAS to bed the night before surgery, wear comfortable clothes the morning of surgery  11. Place CLEAN SHEETS on your bed the  night of your first shower and DO NOT SLEEP WITH PETS.    Day of Surgery: Do not apply any deodorants/lotions. Please wear clean clothes to the hospital/surgery center.     Please read over the following fact sheets that you were given. Pain Booklet, Coughing and Deep Breathing, MRSA Information and Surgical Site Infection Prevention

## 2017-01-29 NOTE — Progress Notes (Addendum)
Anesthesia Chart Review: Patient is a 56 year old female scheduled for C5-6, C6-7 ACDF on 02/02/17 by Dr. Melina Schools.  History includes never smoker, post-operative N/V, hypothyroidism, anxiety, migraines, hysterectomy, partial thyroidectomy, tonsillectomy. BMI is consistent with morbid obesity.   PCP is Dr. Viviana Simpler.  Meds include Wellbutrin SR, Zyrtec, levothyroxine, Zoloft, turmeric.  BP 135/70   Pulse 87   Temp 36.7 C   Resp 20   Ht 5' (1.524 m)   Wt 228 lb 12.8 oz (103.8 kg)   SpO2 100%   BMI 44.68 kg/m   Preoperative labs noted. Cr 0.63. Glucose 111. CBC WNL.   If no acute changes then I anticipate the she can proceed as planned.  George Hugh [facility] Short Stay Center/Anesthesiology Phone [phone] 01/29/2017 1:29 PM

## 2017-02-01 NOTE — Anesthesia Preprocedure Evaluation (Addendum)
Anesthesia Evaluation  Patient identified by MRN, date of birth, ID band Patient awake    Reviewed: Allergy & Precautions, H&P , NPO status , Patient's Chart, lab work & pertinent test results  History of Anesthesia Complications (+) PONV  Airway Mallampati: III  TM Distance: >3 FB Neck ROM: Full    Dental no notable dental hx. (+) Teeth Intact, Dental Advisory Given   Pulmonary neg pulmonary ROS,    Pulmonary exam normal breath sounds clear to auscultation       Cardiovascular Exercise Tolerance: Good negative cardio ROS   Rhythm:Regular Rate:Normal     Neuro/Psych  Headaches, Anxiety negative psych ROS   GI/Hepatic negative GI ROS, Neg liver ROS,   Endo/Other  Hypothyroidism Morbid obesity  Renal/GU negative Renal ROS  negative genitourinary   Musculoskeletal   Abdominal   Peds  Hematology negative hematology ROS (+)   Anesthesia Other Findings   Reproductive/Obstetrics negative OB ROS                           Anesthesia Physical Anesthesia Plan  ASA: II  Anesthesia Plan: General   Post-op Pain Management:    Induction: Intravenous  PONV Risk Score and Plan: 4 or greater and Ondansetron, Dexamethasone, Midazolam, Scopolamine patch - Pre-op and Propofol infusion  Airway Management Planned: Oral ETT and Video Laryngoscope Planned  Additional Equipment:   Intra-op Plan:   Post-operative Plan: Extubation in OR  Informed Consent: I have reviewed the patients History and Physical, chart, labs and discussed the procedure including the risks, benefits and alternatives for the proposed anesthesia with the patient or authorized representative who has indicated his/her understanding and acceptance.   Dental advisory given  Plan Discussed with: CRNA  Anesthesia Plan Comments:       Anesthesia Quick Evaluation

## 2017-02-02 ENCOUNTER — Encounter (HOSPITAL_COMMUNITY): Payer: Self-pay

## 2017-02-02 ENCOUNTER — Encounter (HOSPITAL_COMMUNITY)
Admission: RE | Disposition: A | Discharge status: Home / Self Care | Payer: Self-pay | Source: Ambulatory Visit | Attending: Orthopedic Surgery

## 2017-02-02 ENCOUNTER — Ambulatory Visit (HOSPITAL_COMMUNITY): Payer: Federal, State, Local not specified - PPO | Admitting: Vascular Surgery

## 2017-02-02 ENCOUNTER — Observation Stay (HOSPITAL_COMMUNITY)
Admission: RE | Admit: 2017-02-02 | Discharge: 2017-02-03 | Disposition: A | Discharge status: Home / Self Care | Payer: Federal, State, Local not specified - PPO | Source: Ambulatory Visit | Attending: Orthopedic Surgery | Admitting: Orthopedic Surgery

## 2017-02-02 ENCOUNTER — Ambulatory Visit (HOSPITAL_COMMUNITY): Payer: Federal, State, Local not specified - PPO

## 2017-02-02 DIAGNOSIS — E039 Hypothyroidism, unspecified: Secondary | ICD-10-CM | POA: Insufficient documentation

## 2017-02-02 DIAGNOSIS — M4722 Other spondylosis with radiculopathy, cervical region: Secondary | ICD-10-CM | POA: Diagnosis not present

## 2017-02-02 DIAGNOSIS — Z981 Arthrodesis status: Secondary | ICD-10-CM

## 2017-02-02 DIAGNOSIS — F419 Anxiety disorder, unspecified: Secondary | ICD-10-CM | POA: Insufficient documentation

## 2017-02-02 DIAGNOSIS — M50323 Other cervical disc degeneration at C6-C7 level: Secondary | ICD-10-CM | POA: Diagnosis not present

## 2017-02-02 DIAGNOSIS — Z79899 Other long term (current) drug therapy: Secondary | ICD-10-CM | POA: Diagnosis not present

## 2017-02-02 DIAGNOSIS — M2578 Osteophyte, vertebrae: Secondary | ICD-10-CM | POA: Insufficient documentation

## 2017-02-02 DIAGNOSIS — M4322 Fusion of spine, cervical region: Secondary | ICD-10-CM | POA: Diagnosis not present

## 2017-02-02 DIAGNOSIS — M5412 Radiculopathy, cervical region: Secondary | ICD-10-CM | POA: Diagnosis not present

## 2017-02-02 DIAGNOSIS — Z6841 Body Mass Index (BMI) 40.0 and over, adult: Secondary | ICD-10-CM | POA: Insufficient documentation

## 2017-02-02 DIAGNOSIS — M50322 Other cervical disc degeneration at C5-C6 level: Secondary | ICD-10-CM | POA: Diagnosis not present

## 2017-02-02 DIAGNOSIS — Z419 Encounter for procedure for purposes other than remedying health state, unspecified: Secondary | ICD-10-CM

## 2017-02-02 HISTORY — PX: ANTERIOR CERVICAL DECOMP/DISCECTOMY FUSION: SHX1161

## 2017-02-02 SURGERY — ANTERIOR CERVICAL DECOMPRESSION/DISCECTOMY FUSION 1 LEVEL
Anesthesia: General

## 2017-02-02 MED ORDER — HYDROMORPHONE HCL 1 MG/ML IJ SOLN: 0.2500 mg | INTRAMUSCULAR | Status: DC | PRN

## 2017-02-02 MED ORDER — SERTRALINE HCL 50 MG PO TABS
50.0000 mg | ORAL_TABLET | Freq: Every day | ORAL | Status: DC
  Administered 2017-02-02 – 2017-02-03 (×2): 50 mg via ORAL
  Filled 2017-02-02 (×2): qty 1

## 2017-02-02 MED ORDER — DEXAMETHASONE SODIUM PHOSPHATE 10 MG/ML IJ SOLN
INTRAMUSCULAR | Status: AC
  Filled 2017-02-02: qty 1

## 2017-02-02 MED ORDER — MIDAZOLAM HCL 2 MG/2ML IJ SOLN
INTRAMUSCULAR | Status: AC
  Filled 2017-02-02: qty 2

## 2017-02-02 MED ORDER — SUCCINYLCHOLINE CHLORIDE 200 MG/10ML IV SOSY
PREFILLED_SYRINGE | INTRAVENOUS | Status: AC
  Filled 2017-02-02: qty 10

## 2017-02-02 MED ORDER — THROMBIN (RECOMBINANT) 20000 UNITS EX SOLR
CUTANEOUS | Status: DC | PRN
  Administered 2017-02-02: 20000 [IU] via TOPICAL

## 2017-02-02 MED ORDER — 0.9 % SODIUM CHLORIDE (POUR BTL) OPTIME
TOPICAL | Status: DC | PRN
  Administered 2017-02-02 (×2): 1000 mL

## 2017-02-02 MED ORDER — METHOCARBAMOL 1000 MG/10ML IJ SOLN
500.0000 mg | Freq: Four times a day (QID) | INTRAVENOUS | Status: DC | PRN
  Filled 2017-02-02: qty 5

## 2017-02-02 MED ORDER — LEVOTHYROXINE SODIUM 112 MCG PO TABS
112.0000 ug | ORAL_TABLET | Freq: Every day | ORAL | Status: DC
  Administered 2017-02-02: 112 ug via ORAL
  Filled 2017-02-02 (×2): qty 1

## 2017-02-02 MED ORDER — ALBUMIN HUMAN 5 % IV SOLN
INTRAVENOUS | Status: DC | PRN
  Administered 2017-02-02: 09:00:00 via INTRAVENOUS

## 2017-02-02 MED ORDER — METHOCARBAMOL 500 MG PO TABS
500.0000 mg | ORAL_TABLET | Freq: Four times a day (QID) | ORAL | Status: DC | PRN
  Administered 2017-02-02 – 2017-02-03 (×3): 500 mg via ORAL
  Filled 2017-02-02 (×3): qty 1

## 2017-02-02 MED ORDER — ACETAMINOPHEN 10 MG/ML IV SOLN
INTRAVENOUS | Status: AC
  Filled 2017-02-02: qty 100

## 2017-02-02 MED ORDER — OXYCODONE-ACETAMINOPHEN 10-325 MG PO TABS: 1.0000 | ORAL_TABLET | ORAL | 0 refills | Status: AC | PRN

## 2017-02-02 MED ORDER — ROCURONIUM BROMIDE 10 MG/ML (PF) SYRINGE
PREFILLED_SYRINGE | INTRAVENOUS | Status: AC
  Filled 2017-02-02: qty 5

## 2017-02-02 MED ORDER — ONDANSETRON HCL 4 MG/2ML IJ SOLN: 4.0000 mg | Freq: Four times a day (QID) | INTRAMUSCULAR | Status: DC | PRN

## 2017-02-02 MED ORDER — PHENYLEPHRINE 40 MCG/ML (10ML) SYRINGE FOR IV PUSH (FOR BLOOD PRESSURE SUPPORT)
PREFILLED_SYRINGE | INTRAVENOUS | Status: AC
  Filled 2017-02-02: qty 10

## 2017-02-02 MED ORDER — MORPHINE SULFATE (PF) 2 MG/ML IV SOLN
2.0000 mg | INTRAVENOUS | Status: DC | PRN
Start: 2017-02-02 — End: 2017-02-02

## 2017-02-02 MED ORDER — SCOPOLAMINE 1 MG/3DAYS TD PT72
MEDICATED_PATCH | TRANSDERMAL | Status: AC
  Filled 2017-02-02: qty 1

## 2017-02-02 MED ORDER — MENTHOL 3 MG MT LOZG
1.0000 | LOZENGE | OROMUCOSAL | Status: DC | PRN
  Filled 2017-02-02: qty 9

## 2017-02-02 MED ORDER — SUGAMMADEX SODIUM 200 MG/2ML IV SOLN
INTRAVENOUS | Status: AC
  Filled 2017-02-02: qty 2

## 2017-02-02 MED ORDER — LIDOCAINE 2% (20 MG/ML) 5 ML SYRINGE
INTRAMUSCULAR | Status: AC
  Filled 2017-02-02: qty 10

## 2017-02-02 MED ORDER — ONDANSETRON HCL 4 MG/2ML IJ SOLN
INTRAMUSCULAR | Status: AC
  Filled 2017-02-02: qty 2

## 2017-02-02 MED ORDER — DOCUSATE SODIUM 100 MG PO CAPS
100.0000 mg | ORAL_CAPSULE | Freq: Two times a day (BID) | ORAL | Status: DC
  Administered 2017-02-02 – 2017-02-03 (×3): 100 mg via ORAL
  Filled 2017-02-02 (×3): qty 1

## 2017-02-02 MED ORDER — ACETAMINOPHEN 325 MG PO TABS: 650.0000 mg | ORAL_TABLET | ORAL | Status: DC | PRN

## 2017-02-02 MED ORDER — POLYETHYLENE GLYCOL 3350 17 G PO PACK: 17.0000 g | PACK | Freq: Every day | ORAL | Status: DC | PRN

## 2017-02-02 MED ORDER — MIDAZOLAM HCL 5 MG/5ML IJ SOLN
INTRAMUSCULAR | Status: DC | PRN
  Administered 2017-02-02: 2 mg via INTRAVENOUS

## 2017-02-02 MED ORDER — FENTANYL CITRATE (PF) 250 MCG/5ML IJ SOLN
INTRAMUSCULAR | Status: AC
  Filled 2017-02-02: qty 5

## 2017-02-02 MED ORDER — LACTATED RINGERS IV SOLN
INTRAVENOUS | Status: DC
  Administered 2017-02-02: 14:00:00 via INTRAVENOUS

## 2017-02-02 MED ORDER — OXYCODONE HCL 5 MG PO TABS
10.0000 mg | ORAL_TABLET | ORAL | Status: DC | PRN
Start: 2017-02-02 — End: 2017-02-03
  Administered 2017-02-02 – 2017-02-03 (×3): 10 mg via ORAL
  Filled 2017-02-02 (×3): qty 2

## 2017-02-02 MED ORDER — OXYCODONE HCL 5 MG PO TABS
5.0000 mg | ORAL_TABLET | ORAL | Status: DC | PRN
  Administered 2017-02-02 (×2): 5 mg via ORAL
  Filled 2017-02-02 (×2): qty 1

## 2017-02-02 MED ORDER — FENTANYL CITRATE (PF) 100 MCG/2ML IJ SOLN
INTRAMUSCULAR | Status: DC | PRN
  Administered 2017-02-02 (×3): 50 ug via INTRAVENOUS
  Administered 2017-02-02: 100 ug via INTRAVENOUS
  Administered 2017-02-02 (×3): 50 ug via INTRAVENOUS

## 2017-02-02 MED ORDER — SODIUM CHLORIDE 0.9% FLUSH: 3.0000 mL | INTRAVENOUS | Status: DC | PRN

## 2017-02-02 MED ORDER — LIDOCAINE HCL 4 % MT SOLN
OROMUCOSAL | Status: DC | PRN
  Administered 2017-02-02: 4 mL via TOPICAL

## 2017-02-02 MED ORDER — ONDANSETRON 4 MG PO TBDP: 4.0000 mg | ORAL_TABLET | Freq: Three times a day (TID) | ORAL | 0 refills | Status: DC | PRN

## 2017-02-02 MED ORDER — PROPOFOL 10 MG/ML IV BOLUS
INTRAVENOUS | Status: AC
  Filled 2017-02-02: qty 40

## 2017-02-02 MED ORDER — MAGNESIUM CITRATE PO SOLN: 1.0000 | Freq: Once | ORAL | Status: DC | PRN

## 2017-02-02 MED ORDER — ACETAMINOPHEN 650 MG RE SUPP: 650.0000 mg | RECTAL | Status: DC | PRN

## 2017-02-02 MED ORDER — MORPHINE SULFATE (PF) 4 MG/ML IV SOLN: 2.0000 mg | INTRAVENOUS | Status: DC | PRN

## 2017-02-02 MED ORDER — THROMBIN 20000 UNITS EX KIT
PACK | CUTANEOUS | Status: AC
  Filled 2017-02-02: qty 1

## 2017-02-02 MED ORDER — PHENYLEPHRINE HCL 10 MG/ML IJ SOLN
INTRAMUSCULAR | Status: DC | PRN
  Administered 2017-02-02 (×2): 80 ug via INTRAVENOUS

## 2017-02-02 MED ORDER — ONDANSETRON HCL 4 MG/2ML IJ SOLN
INTRAMUSCULAR | Status: DC | PRN
  Administered 2017-02-02: 4 mg via INTRAVENOUS

## 2017-02-02 MED ORDER — LIDOCAINE HCL (CARDIAC) 20 MG/ML IV SOLN
INTRAVENOUS | Status: DC | PRN
  Administered 2017-02-02: 60 mg via INTRAVENOUS

## 2017-02-02 MED ORDER — PROPOFOL 10 MG/ML IV BOLUS
INTRAVENOUS | Status: DC | PRN
  Administered 2017-02-02: 140 mg via INTRAVENOUS

## 2017-02-02 MED ORDER — LACTATED RINGERS IV SOLN
INTRAVENOUS | Status: DC | PRN
  Administered 2017-02-02 (×2): via INTRAVENOUS

## 2017-02-02 MED ORDER — ARTIFICIAL TEARS OPHTHALMIC OINT
TOPICAL_OINTMENT | OPHTHALMIC | Status: AC
  Filled 2017-02-02: qty 3.5

## 2017-02-02 MED ORDER — ACETAMINOPHEN 10 MG/ML IV SOLN
INTRAVENOUS | Status: DC | PRN
  Administered 2017-02-02: 1000 mg via INTRAVENOUS

## 2017-02-02 MED ORDER — SUCCINYLCHOLINE CHLORIDE 20 MG/ML IJ SOLN
INTRAMUSCULAR | Status: DC | PRN
  Administered 2017-02-02: 100 mg via INTRAVENOUS

## 2017-02-02 MED ORDER — BUPIVACAINE-EPINEPHRINE (PF) 0.25% -1:200000 IJ SOLN
INTRAMUSCULAR | Status: AC
  Filled 2017-02-02: qty 30

## 2017-02-02 MED ORDER — BUPIVACAINE-EPINEPHRINE 0.25% -1:200000 IJ SOLN
INTRAMUSCULAR | Status: DC | PRN
  Administered 2017-02-02: 10 mL

## 2017-02-02 MED ORDER — LORATADINE 10 MG PO TABS
10.0000 mg | ORAL_TABLET | Freq: Every day | ORAL | Status: DC
  Administered 2017-02-02 – 2017-02-03 (×2): 10 mg via ORAL
  Filled 2017-02-02 (×2): qty 1

## 2017-02-02 MED ORDER — GELATIN ABSORBABLE 12-7 MM EX MISC
CUTANEOUS | Status: DC | PRN
  Administered 2017-02-02: 1

## 2017-02-02 MED ORDER — ROCURONIUM BROMIDE 100 MG/10ML IV SOLN
INTRAVENOUS | Status: DC | PRN
  Administered 2017-02-02: 50 mg via INTRAVENOUS
  Administered 2017-02-02 (×2): 10 mg via INTRAVENOUS

## 2017-02-02 MED ORDER — SODIUM CHLORIDE 0.9% FLUSH: 3.0000 mL | Freq: Two times a day (BID) | INTRAVENOUS | Status: DC

## 2017-02-02 MED ORDER — PHENOL 1.4 % MT LIQD
1.0000 | OROMUCOSAL | Status: DC | PRN
  Filled 2017-02-02: qty 177

## 2017-02-02 MED ORDER — DEXAMETHASONE SODIUM PHOSPHATE 10 MG/ML IJ SOLN
INTRAMUSCULAR | Status: DC | PRN
  Administered 2017-02-02: 10 mg via INTRAVENOUS

## 2017-02-02 MED ORDER — BACLOFEN 10 MG PO TABS: 10.0000 mg | ORAL_TABLET | Freq: Three times a day (TID) | ORAL | 1 refills | Status: DC

## 2017-02-02 MED ORDER — VANCOMYCIN HCL 10 G IV SOLR
1500.0000 mg | Freq: Once | INTRAVENOUS | Status: AC
  Administered 2017-02-02: 1500 mg via INTRAVENOUS
  Filled 2017-02-02: qty 1500

## 2017-02-02 MED ORDER — VANCOMYCIN HCL IN DEXTROSE 1-5 GM/200ML-% IV SOLN
1000.0000 mg | INTRAVENOUS | Status: AC
  Administered 2017-02-02: 1000 mg via INTRAVENOUS
  Filled 2017-02-02: qty 200

## 2017-02-02 MED ORDER — SUGAMMADEX SODIUM 200 MG/2ML IV SOLN
INTRAVENOUS | Status: DC | PRN
  Administered 2017-02-02: 200 mg via INTRAVENOUS

## 2017-02-02 MED ORDER — SCOPOLAMINE 1 MG/3DAYS TD PT72
MEDICATED_PATCH | TRANSDERMAL | Status: DC | PRN
  Administered 2017-02-02: 1 via TRANSDERMAL

## 2017-02-02 MED ORDER — ONDANSETRON HCL 4 MG PO TABS: 4.0000 mg | ORAL_TABLET | Freq: Four times a day (QID) | ORAL | Status: DC | PRN

## 2017-02-02 MED ORDER — BUPROPION HCL ER (SR) 150 MG PO TB12: 150.0000 mg | ORAL_TABLET | Freq: Every day | ORAL | Status: DC

## 2017-02-02 MED ORDER — SODIUM CHLORIDE 0.9 % IV SOLN: 250.0000 mL | INTRAVENOUS | Status: DC

## 2017-02-02 SURGICAL SUPPLY — 62 items
BLADE CLIPPER SURG (BLADE) IMPLANT
BONE VIVIGEN FORMABLE 5.4CC (Bone Implant) ×2 IMPLANT
CANISTER SUCT 3000ML PPV (MISCELLANEOUS) ×2 IMPLANT
CLSR STERI-STRIP ANTIMIC 1/2X4 (GAUZE/BANDAGES/DRESSINGS) ×2 IMPLANT
COLLAR CERV LO CONTOUR FIRM DE (SOFTGOODS) ×1 IMPLANT
CORDS BIPOLAR (ELECTRODE) ×2 IMPLANT
COVER SURGICAL LIGHT HANDLE (MISCELLANEOUS) ×3 IMPLANT
CRADLE DONUT ADULT HEAD (MISCELLANEOUS) ×2 IMPLANT
DEVICE ENDSKLTN IMPLANT SM 7MM (Cage) IMPLANT
DRAPE C-ARM 42X72 X-RAY (DRAPES) ×2 IMPLANT
DRAPE POUCH INSTRU U-SHP 10X18 (DRAPES) ×2 IMPLANT
DRAPE SURG 17X23 STRL (DRAPES) ×2 IMPLANT
DRAPE U-SHAPE 47X51 STRL (DRAPES) ×2 IMPLANT
DRSG AQUACEL AG ADV 3.5X 4 (GAUZE/BANDAGES/DRESSINGS) ×2 IMPLANT
DURAPREP 6ML APPLICATOR 50/CS (WOUND CARE) ×2 IMPLANT
ELECT COATED BLADE 2.86 ST (ELECTRODE) ×2 IMPLANT
ELECT PENCIL ROCKER SW 15FT (MISCELLANEOUS) ×2 IMPLANT
ELECT REM PT RETURN 9FT ADLT (ELECTROSURGICAL) ×2
ELECTRODE REM PT RTRN 9FT ADLT (ELECTROSURGICAL) ×1 IMPLANT
ENDOSKELETON IMPLANT SM 7MM (Cage) ×4 IMPLANT
GLOVE BIO SURGEON STRL SZ 6.5 (GLOVE) ×2 IMPLANT
GLOVE BIOGEL PI IND STRL 6.5 (GLOVE) ×1 IMPLANT
GLOVE BIOGEL PI IND STRL 8.5 (GLOVE) ×1 IMPLANT
GLOVE BIOGEL PI INDICATOR 6.5 (GLOVE) ×1
GLOVE BIOGEL PI INDICATOR 8.5 (GLOVE) ×1
GLOVE SS BIOGEL STRL SZ 8.5 (GLOVE) ×1 IMPLANT
GLOVE SUPERSENSE BIOGEL SZ 8.5 (GLOVE) ×1
GOWN STRL REUS W/ TWL LRG LVL3 (GOWN DISPOSABLE) ×1 IMPLANT
GOWN STRL REUS W/TWL 2XL LVL3 (GOWN DISPOSABLE) ×4 IMPLANT
GOWN STRL REUS W/TWL LRG LVL3 (GOWN DISPOSABLE) ×2
GRAFT BNE MATRIX VG FRMBL MD 5 (Bone Implant) IMPLANT
KIT BASIN OR (CUSTOM PROCEDURE TRAY) ×2 IMPLANT
KIT ROOM TURNOVER OR (KITS) ×2 IMPLANT
NDL SPNL 18GX3.5 QUINCKE PK (NEEDLE) ×1 IMPLANT
NEEDLE SPNL 18GX3.5 QUINCKE PK (NEEDLE) ×2 IMPLANT
NS IRRIG 1000ML POUR BTL (IV SOLUTION) ×4 IMPLANT
PACK ORTHO CERVICAL (CUSTOM PROCEDURE TRAY) ×2 IMPLANT
PACK UNIVERSAL I (CUSTOM PROCEDURE TRAY) ×2 IMPLANT
PAD ARMBOARD 7.5X6 YLW CONV (MISCELLANEOUS) ×5 IMPLANT
PATTIES SURGICAL .25X.25 (GAUZE/BANDAGES/DRESSINGS) ×1 IMPLANT
PIN DISTRACTION 14 (PIN) ×2 IMPLANT
PLATE SKYLINE TWO LEVEL 32MM (Plate) IMPLANT
PLATE TWO LEVEL SKYLINE 30MM (Plate) ×1 IMPLANT
RESTRAINT LIMB HOLDER UNIV (RESTRAINTS) ×2 IMPLANT
SCREW SELF DRILL SKYLINE 12MM (Screw) ×2 IMPLANT
SCREW SKYLINE 14MM SD-VA (Screw) ×2 IMPLANT
SCREW SKYLINE VARIABLE LG (Screw) ×2 IMPLANT
SPONGE INTESTINAL PEANUT (DISPOSABLE) ×2 IMPLANT
SPONGE SURGIFOAM ABS GEL 100 (HEMOSTASIS) ×2 IMPLANT
SURGIFLO W/THROMBIN 8M KIT (HEMOSTASIS) IMPLANT
SUT BONE WAX W31G (SUTURE) ×2 IMPLANT
SUT MON AB 3-0 SH 27 (SUTURE) ×2
SUT MON AB 3-0 SH27 (SUTURE) ×1 IMPLANT
SUT VIC AB 2-0 CT1 18 (SUTURE) ×2 IMPLANT
SYR BULB IRRIGATION 50ML (SYRINGE) ×2 IMPLANT
SYR CONTROL 10ML LL (SYRINGE) ×2 IMPLANT
TAPE CLOTH 4X10 WHT NS (GAUZE/BANDAGES/DRESSINGS) ×2 IMPLANT
TAPE UMBILICAL COTTON 1/8X30 (MISCELLANEOUS) ×3 IMPLANT
TOWEL OR 17X24 6PK STRL BLUE (TOWEL DISPOSABLE) ×2 IMPLANT
TOWEL OR 17X26 10 PK STRL BLUE (TOWEL DISPOSABLE) ×2 IMPLANT
WATER STERILE IRR 1000ML POUR (IV SOLUTION) ×2 IMPLANT
YANKAUER SUCT BULB TIP NO VENT (SUCTIONS) ×1 IMPLANT

## 2017-02-02 NOTE — Anesthesia Procedure Notes (Signed)
Procedure Name: Intubation Date/Time: 02/02/2017 7:40 AM Performed by: Scheryl Darter Pre-anesthesia Checklist: Patient identified, Emergency Drugs available, Suction available and Patient being monitored Patient Re-evaluated:Patient Re-evaluated prior to induction Oxygen Delivery Method: Circle System Utilized Preoxygenation: Pre-oxygenation with 100% oxygen Induction Type: IV induction Ventilation: Mask ventilation without difficulty Laryngoscope Size: Glidescope and 3 Grade View: Grade II Tube type: Oral Tube size: 7.0 mm Number of attempts: 1 Airway Equipment and Method: Stylet and Oral airway Placement Confirmation: ETT inserted through vocal cords under direct vision,  positive ETCO2 and breath sounds checked- equal and bilateral Secured at: 21 cm Tube secured with: Tape Dental Injury: Teeth and Oropharynx as per pre-operative assessment  Difficulty Due To: Difficult Airway- due to reduced neck mobility, Difficult Airway- due to large tongue, Difficult Airway- due to anterior larynx, Difficult Airway- due to limited oral opening and Difficulty was anticipated

## 2017-02-02 NOTE — Op Note (Signed)
Operative report.  Preoperative diagnosis.  2 level cervical spondylitic radiculopathy.  C5-6, C6-7.  Postoperative diagnosis.  Same.  Operative procedure.  Anterior cervical discectomy and fusion (ACDF) C5-7.  Complications.  The inferior C7 screws felt to be slightly long they were replaced with a 12 mm rescue screws.  Implant system used Titan Nano lock size 7 small lordotic intervertebral cages packed with vivogen.  Pugh anterior cervical skyline plate.  Environmental consultant.  [facility]  Indications.  This is a pleasant 56 year old woman who has had progressive debilitating neck and neuropathic right arm pain.  Patient continued to deteriorate despite conservative intervention.  As a result of progressive motor and sensory deficits, as well as pain we elected to proceed with surgery.  All appropriate risks benefits and alternatives were discussed with the patient.  Operative note.  Patient was brought the operating room placed supine the operating room table.  After successful induction of general anesthesia and endotracheally patient teds SCDs were applied in the anterior cervical spine was prepped and draped in standard fashion.  A timeout was taken to confirm patient procedure and all other important data.  Chin halter traction was used to aid in visualization the patient's body habitus.  In addition wrist restraints were also you to apply traction to allow for better visualization.  I identified the C6 vertebral body with x-ray marked at the incision site and infiltrated with quarter percent Marcaine with epinephrine.  Transverse midline incision was made and sharp dissection was carried out down to the platysma.  This was sharply incised and I began dissecting along the medial border the sternocleidomastoid.  I continued my dissection into the deep cervical and prevertebral fascia until I could visualize and palpate the carotid artery.  I then swept the esophagus and trachea over to the right  and protected with a retractor.  Kitner dissectors were used to mobilize the remaining prevertebral fascia to completely expose the anterior cervical spine.  This is a standard Smith-Robinson approach to the anterior cervical spine.  A needle was placed into the C5 disc space and an x-ray was taken to confirm we were at the appropriate level.  Once I confirm the level I marked the disc space continued with my dissection.  The omohyoid muscle was identified isolated and sacrificed in order to allow for better visualization of the anterior cervical spine.  The longus coli muscles were mobilized out to the level of the uncovertebral joint using bipolar electrocautery.  Self-retaining retractors were placed underneath the omohyoid muscle the endotracheal cuff was deflated and expanded the retractors to the appropriate width.  Cuff was then reinflated to the new set point.  An annulotomy was performed at C6-7 using a 15 blade scalpel.  Distraction pins were placed into the body of see 6 and C7 in the intervertebral space was distracted.  This was maintained using the distraction pin set.  Using pituitary rongeurs to remove the bulk of the disc material.  I then used a 2 and 3 mm Kerrison punch to resect the overhanging osteophyte from the inferior aspect of the C6 vertebral body.  I then used curettes to resect the remaining cartilaginous endplate and disc material until I had the posterior annulus exposed.  I then used fine nerve neural curettes to continue my posterior dissection.  A 1 mm Kerrison rongeur was used to trim down the posterior osteophytes from the bodies of 6 and 7.  I also undercut the uncovertebral joints.  A small nerve hook  was used to dissect through the posterior annulus and posterior longitudinal ligament.  Once I developed a plane underneath the PLL I use my 1 mm Kerrison rongeur to resect the PLL.  At this point I was able to remove the hard disc osteophyte in the right posterior lateral  corner.  This was consistent with the MRI.  At this point I was pleased with the posterior decompression and discectomy.  The entire posterior longitudinal ligament was resected and I was able to undercut the uncovertebral joints to adequately decompress the C7 nerve.  At this point I rasped the endplates and then trialed with various implants and elected to use a size 7 small lordotic cage.  This cage was obtained packed with the allograft bone and then malleted to the appropriate depth.  I had excellent purchase.  At this point remove the distraction pin from the body is seen 7 and repositioned the body of C5.  Using the exact same technique I placed my retractors, distraction pins and exposed the C5-6 disc space.  Again an annulotomy was performed and I resected all of the disc material as well as resected the posterior longitudinal ligament.  Make sure the osteophytes were removed and an adequate uncovertebral joint decompression was done especially on the right side.  At this point I then placed the same size graft.  With both intervertebral cage was properly positioned I then contoured an anterior cervical plate and secured it to the bodies of C5 and 7 with 14 mm screws.  I placed 12 mm screws into the body of C2 6.  It is very difficult seeing the lateral view.  When I took the AP view I felt as though the C7 screw may have violated the inferior endplate at the Y8-X4 disc space.  Again because it was difficult to visualize I elected to remove those 214 mm screws and replaced them with 12 mm rescue screws.  Again all 6 screws had excellent purchase.  And they were torqued down according manufacturer standards the wound was then copious irrigated with normal saline and hemostasis was confirmed.  All retracting pins were removed.  I then returned the trachea and esophagus to midline.  Closed the wound in a layered fashion.  2-0 Vicryl for the platysma 3-0 Monocryl for the skin.  Steri-Strips and a dry  dressing and Aspen collar were applied.  Patient was extubated and transferred the PACU without incident.  At the end of the case all needle sponge counts were correct.  There were no adverse intraoperative events.

## 2017-02-02 NOTE — Evaluation (Signed)
Physical Therapy Evaluation Patient Details Name: Alexis Knight MRN: [id number] DOB: 31-Jul-1960 Today's Date: 02/02/2017   History of Present Illness  Pt is a 56 y/o female s/p C5-7 ACDF. PMH includes anxiety, R biceps tear, plantar fasciitis, and R chondromalcia patellae.   Clinical Impression  Patient is s/p above surgery resulting in the deficits listed below (see PT Problem List). PTA, pt was independent with mobility. Upon eval, pt slightly unsteady and had LOB X 2 and required min A for steadying assist. Otherwise, required supervision. Reports family will be able to assist at d/c. Patient will benefit from skilled PT to increase their independence and safety with mobility (while adhering to their precautions) to allow discharge to the venue listed below. Will continue to follow acutely.      Follow Up Recommendations No PT follow up;Supervision for mobility/OOB    Equipment Recommendations  None recommended by PT    Recommendations for Other Services       Precautions / Restrictions Precautions Precautions: Cervical Precaution Comments: Reviewed cervical precautions with pt.  Required Braces or Orthoses: Cervical Brace Cervical Brace: Hard collar Restrictions Weight Bearing Restrictions: No      Mobility  Bed Mobility Overal bed mobility: Needs Assistance Bed Mobility: Rolling;Sidelying to Sit;Sit to Sidelying Rolling: Supervision Sidelying to sit: Supervision     Sit to sidelying: Supervision General bed mobility comments: Supervision to ensure log roll technique. Verbal cues for hand placement and use of LE to assist with rolling.   Transfers Overall transfer level: Needs assistance Equipment used: None Transfers: Sit to/from Stand Sit to Stand: Min guard         General transfer comment: Min guard for safety.   Ambulation/Gait Ambulation/Gait assistance: Min guard;Min assist Ambulation Distance (Feet): 200 Feet Assistive device: None Gait  Pattern/deviations: Step-through pattern;Decreased stride length Gait velocity: Decreased Gait velocity interpretation: Below normal speed for age/gender General Gait Details: Slow, cautious gait. LOB X 2 and required min A for steadying. Otherwise required min guard for safety. Educated about generalized walking program to perform at home.   Stairs            Wheelchair Mobility    Modified Rankin (Stroke Patients Only)       Balance Overall balance assessment: Needs assistance Sitting-balance support: No upper extremity supported;Feet supported Sitting balance-Leahy Scale: Good     Standing balance support: No upper extremity supported;During functional activity Standing balance-Leahy Scale: Fair                               Pertinent Vitals/Pain Pain Assessment: 0-10 Pain Score: 8  Pain Location: neck  Pain Descriptors / Indicators: Aching;Operative site guarding Pain Intervention(s): Limited activity within patient's tolerance;Monitored during session;Repositioned    Home Living Family/patient expects to be discharged to:: Private residence Living Arrangements: Spouse/significant other Available Help at Discharge: Family;Available 24 hours/day Type of Home: House Home Access: Stairs to enter Entrance Stairs-Rails: None Entrance Stairs-Number of Steps: 2 Home Layout: One level Home Equipment: Shower seat      Prior Function Level of Independence: Independent               Hand Dominance   Dominant Hand: Left    Extremity/Trunk Assessment   Upper Extremity Assessment Upper Extremity Assessment: Defer to OT evaluation    Lower Extremity Assessment Lower Extremity Assessment: Overall WFL for tasks assessed    Cervical / Trunk Assessment Cervical / Trunk Assessment: Other  exceptions Cervical / Trunk Exceptions: s/p acdf   Communication   Communication: No difficulties  Cognition Arousal/Alertness: Awake/alert Behavior During  Therapy: WFL for tasks assessed/performed Overall Cognitive Status: Within Functional Limits for tasks assessed                                        General Comments General comments (skin integrity, edema, etc.): Pt family present during session.     Exercises     Assessment/Plan    PT Assessment Patient needs continued PT services  PT Problem List Decreased balance;Decreased mobility;Decreased knowledge of precautions;Pain       PT Treatment Interventions Gait training;Stair training;Functional mobility training;Therapeutic activities;Therapeutic exercise;Balance training;Neuromuscular re-education;Patient/family education    PT Goals (Current goals can be found in the Care Plan section)  Acute Rehab PT Goals Patient Stated Goal: to go home  PT Goal Formulation: With patient Time For Goal Achievement: 02/09/17 Potential to Achieve Goals: Good    Frequency Min 5X/week   Barriers to discharge        Co-evaluation               AM-PAC PT "6 Clicks" Daily Activity  Outcome Measure Difficulty turning over in bed (including adjusting bedclothes, sheets and blankets)?: None Difficulty moving from lying on back to sitting on the side of the bed? : None Difficulty sitting down on and standing up from a chair with arms (e.g., wheelchair, bedside commode, etc,.)?: Unable Help needed moving to and from a bed to chair (including a wheelchair)?: A Little Help needed walking in hospital room?: A Little Help needed climbing 3-5 steps with a railing? : A Little 6 Click Score: 18    End of Session Equipment Utilized During Treatment: Gait belt;Cervical collar Activity Tolerance: Patient tolerated treatment well Patient left: in bed;with call bell/phone within reach;with family/visitor present Nurse Communication: Mobility status PT Visit Diagnosis: Unsteadiness on feet (R26.81);Pain Pain - part of body:  (neck )    Time: 1433-1450 PT Time Calculation  (min) (ACUTE ONLY): 17 min   Charges:   PT Evaluation $PT Eval Low Complexity: 1 Low     PT G Codes:   PT G-Codes **NOT FOR INPATIENT CLASS** Functional Assessment Tool Used: AM-PAC 6 Clicks Basic Mobility;Clinical judgement Functional Limitation: Mobility: Walking and moving around Mobility: Walking and Moving Around Current Status (I6270): At least 40 percent but less than 60 percent impaired, limited or restricted Mobility: Walking and Moving Around Goal Status [id number]): At least 1 percent but less than 20 percent impaired, limited or restricted    Leighton Ruff, PT, DPT  Acute Rehabilitation Services  Pager: [place] 02/02/2017, 2:58 PM

## 2017-02-02 NOTE — Brief Op Note (Signed)
02/02/2017  11:50 AM  PATIENT:  Alexis Knight  56 y.o. female  PRE-OPERATIVE DIAGNOSIS:  Cervical spondylotic radiculopathy  POST-OPERATIVE DIAGNOSIS:  Cervical spondylotic radiculopathy  PROCEDURE:  Procedure(s): ANTERIOR CERVICAL DECOMPRESSION/DISCECTOMY FUSION C5-6, C6-7 (N/A)  SURGEON:  Surgeon(s) and Role:    Melina Schools, MD - Primary  PHYSICIAN ASSISTANT:   ASSISTANTS: Carmen Mayo   ANESTHESIA:   general  EBL:  40 mL   BLOOD ADMINISTERED:none  DRAINS: none   LOCAL MEDICATIONS USED:  MARCAINE     SPECIMEN:  No Specimen  DISPOSITION OF SPECIMEN:  N/A  COUNTS:  YES  TOURNIQUET:  * No tourniquets in log *  DICTATION: .Dragon Dictation  PLAN OF CARE: Admit for overnight observation  PATIENT DISPOSITION:  PACU - hemodynamically stable.

## 2017-02-02 NOTE — Progress Notes (Signed)
Pharmacy Antibiotic Note  Alexis Knight is a 56 y.o. female admitted on 02/02/2017 with surgical prophylaxis.  Pharmacy has been consulted for vancomycin dosing.  Pre-op vancomycin 1g given at 0715. No drain in place.  Plan: Vancomycin 1500mg  IV x1 to be given at [place] to sign off as no other doses needed  Height: 5' (152.4 cm) Weight: 228 lb 13 oz (103.8 kg) IBW/kg (Calculated) : 45.5  Temp (24hrs), Avg:98.2 F (36.8 C), Min:97.7 F (36.5 C), Max:99.1 F (37.3 C)  No results for input(s): WBC, CREATININE, LATICACIDVEN, VANCOTROUGH, VANCOPEAK, VANCORANDOM, GENTTROUGH, GENTPEAK, GENTRANDOM, TOBRATROUGH, TOBRAPEAK, TOBRARND, AMIKACINPEAK, AMIKACINTROU, AMIKACIN in the last 168 hours.  Estimated Creatinine Clearance: 88.3 mL/min (by C-G formula based on SCr of 0.62 mg/dL).    Allergies  Allergen Reactions  . Penicillins Shortness Of Breath and Other (See Comments)    Pt CAN take amoxicillin and Keflex with no problems  . Metronidazole Hives  . Sulfa Antibiotics Rash   Thank you for allowing pharmacy to be a part of this patient's care.  Alexis Knight 02/02/2017 1:36 PM

## 2017-02-02 NOTE — Transfer of Care (Signed)
Immediate Anesthesia Transfer of Care Note  Patient: Alexis Knight  Procedure(s) Performed: ANTERIOR CERVICAL DECOMPRESSION/DISCECTOMY FUSION C5-6, C6-7 (N/A )  Patient Location: PACU  Anesthesia Type:General  Level of Consciousness: awake, alert , oriented and sedated  Airway & Oxygen Therapy: Patient Spontanous Breathing and Patient connected to nasal cannula oxygen  Post-op Assessment: Report given to RN, Post -op Vital signs reviewed and stable and Patient moving all extremities  Post vital signs: Reviewed and stable  Last Vitals:  Vitals:   02/02/17 0629  BP: (!) 151/86  Pulse: (!) 104  Resp: 20  Temp: 36.6 C  SpO2: 97%    Last Pain:  Vitals:   02/02/17 0629  TempSrc: Oral  PainSc:          Complications: No apparent anesthesia complications

## 2017-02-02 NOTE — Anesthesia Postprocedure Evaluation (Signed)
Anesthesia Post Note  Patient: Rayni Siebenaler  Procedure(s) Performed: ANTERIOR CERVICAL DECOMPRESSION/DISCECTOMY FUSION C5-6, C6-7 (N/A )     Patient location during evaluation: PACU Anesthesia Type: General Level of consciousness: awake and alert Pain management: pain level controlled Vital Signs Assessment: post-procedure vital signs reviewed and stable Respiratory status: spontaneous breathing, nonlabored ventilation, respiratory function stable and patient connected to nasal cannula oxygen Cardiovascular status: blood pressure returned to baseline and stable Postop Assessment: no apparent nausea or vomiting Anesthetic complications: no    Last Vitals:  Vitals:   02/02/17 1345 02/02/17 1347  BP: (!) 142/76   Pulse: (!) 108   Resp: 16   Temp: 37 C   SpO2: (!) 88% 95%    Last Pain:  Vitals:   02/02/17 1345  TempSrc: Oral  PainSc:                  Juancarlos Crescenzo,W. EDMOND

## 2017-02-02 NOTE — H&P (Signed)
History of Present Illness The patient is a 56 year old female who comes in today for a preoperative History and Physical. The patient is scheduled for a C5-7 ACDF to be performed by Dr. Duane Lope D. Rolena Infante, MD at [facility] on 02/02/2017 . Pt reports hx of hypothyroidism.  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)  Acute pain of right knee (M25.561)  Biceps tendonitis on right (M75.21)  Unspecified sprain of right shoulder joint, subsequent encounter (S43.401D)  It band syndrome, right (M76.31)  Degeneration of intervertebral disc at C6-C7 level (M50.323)  Cervical pain (M54.2)  Traumatic partial tear of right biceps tendon (H65.790X)  *RIGHT [01/22/1990]: (Marked as Inactive) Tenosynovitis, radial styloid (727.04) [01/22/1990]: (Marked as Inactive) Sprain/strain, ankle NOS (845.00) [10/15/1990]: (Marked as Inactive) Pain in joint, ankle and foot (M25.579) [09/14/1993]: (Marked as Inactive) Sprain/strain, acromioclavicular (840.0) [07/03/2000]: (Marked as Inactive) Plantar fasciitis (M72.2) [04/16/2002]: (Marked as Inactive) Pain in joint, shoulder region (M25.519) [02/19/2004]: (Marked as Inactive) Rotator cuff strain (S46.019A) [03/01/2004]: (Marked as Inactive) Pain in limb (M79.609) [07/26/2005]: (Marked as Inactive) Sprain/strain, foot NOS (845.10) [11/20/2006]: (Marked as Inactive) Problems Reconciled   Allergies  PENICILLIN [09/23/2016]: LODINE [09/23/2016]: CELEBREX [09/23/2016]: Allergies Reconciled   Family History  Hypertension  Brother, Father.  Social History  Tobacco use  Never smoker. Living situation  live with spouse Marital status  married Exercise  Exercises daily; does running / walking Children  1 Current work status  working full time Never consumed alcohol  07/24/2013: Never consumed alcohol Number of flights of stairs before winded  2-3 No  history of drug/alcohol rehab  Not under pain contract   Medication History  Sertraline HCl (50MG  Tablet, Oral) Active. Levothyroxine Sodium (112MCG Tablet, Oral) Active. Glycopyrrolate (2MG  Tablet, Oral) Active. Melatonin (5MG  Tablet, Oral) Active.  Vitals 01/16/2017 10:41 AM Weight: 233 lb Height: 59.5in Body Surface Area: 1.98 m Body Mass Index: 46.27 kg/m  Temp.: 98.32F  Pulse: 61 (Regular)  BP: 144/64 (Sitting, Left Arm, Standard)  General General Appearance-Not in acute distress. Orientation-Oriented X3. Build & Nutrition-Well nourished and Well developed.  Integumentary General Characteristics Surgical Scars - no surgical scar evidence of previous cervical surgery. Cervical Spine-Skin examination of the cervical spine is without deformity, skin lesions, lacerations or abrasions.  Chest and Lung Exam Auscultation Breath sounds - Normal and Clear.  Cardiovascular Auscultation Rhythm - Regular rate and rhythm.  Peripheral Vascular Upper Extremity Palpation - Radial pulse - Bilateral - 2+.  Neurologic Sensation Upper Extremity - Bilateral - sensation is intact in the upper extremity. Reflexes Biceps Reflex - Bilateral - 2+. Brachioradialis Reflex - Bilateral - 2+. Triceps Reflex - Bilateral - 2+. Hoffman's Sign - Bilateral - Hoffman's sign not present.  Musculoskeletal Spine/Ribs/Pelvis  Cervical Spine : Inspection and Palpation - Tenderness - right cervical paraspinals tender to palpation, left cervical paraspinals tender to palpation and right trapezius tender to palpation. Strength and Tone: Strength: Strength: Strength - Deltoid - Bilateral - 5/5. Right - 4-/5. Biceps - Left - 5/5. Triceps - Left - 5/5. Right - 4-/5. Wrist Extension - Left - 5/5. Right - 4-/5. Hand Grip - Bilateral - 5/5. Heel walk - Bilateral - able to heel walk without difficulty. Toe Walk - Bilateral - able to walk on toes without difficulty. Heel-Toe Walk -  Bilateral - able to heel-toe walk without difficulty. ROM - Flexion - Moderately Decreased and painful. Extension - Moderately Decreased and painful. Left Lateral Flexion - Moderately Decreased and  painful. Right Lateral Flexion - Moderately Decreased and painful. Left Rotation - Moderately Decreased and painful. Right Rotation - Moderately Decreased and painful. Pain - neither flexion or extension is more painful than the other. Cervical Spine - Special Testing - axial compression test negative, cross chest impingement test negative. Non-Anatomic Signs - No non-anatomic signs present. Upper Extremity Range of Motion - No truesholder pain with IR/ER of the shoulders.  Goal Of Surgery: Discussed that goal of surgery is to reduce pain and improve function and quality of life. Patient is aware that despite all appropriate treatment that there pain and function could be the same, worse, or different.  Anterior cervical fusion:Risks of surgery include, but are not limited to: Throat pain, swallowing difficulty, hoarseness or change in voice, death, stroke, paralysis, nerve root damage/injury, bleeding, blood clots, loss of bowel/bladder control, hardware failure, or mal-position, spinal fluid leak, adjacent segment disease, non-union, need for further surgery, ongoing or worse pain, infection. Post-operative bleeding or swelling that could require emergent surgery.  The patient now has progressive right bicep weakness and dysesthesias in the C6 dermatome. She continues to have similar findings in C7. Initially when I saw her she had no significant C6 motor deficits but since her last visit in June her symptoms have progressed. Her MRI from January 11, 2017 confirms increased osteophyte complex asymmetric to the right causing C6 nerve irritation. This is progressed since her previous MRI of July 21, 2016.  Given the progression of symptoms which now include C6 and C7 radiculopathy and the progressive  degeneration seen on the updated MRI I think it is reasonable to move forward with a 2 level ACDF. My concern with just doing the single level fusion is that the likelihood that the adjacent segment deteriorates is significant. She is already showing clinical signs of progressive C6 nerve irritation and I do think this would only worsen following 1 level surgery. I have explained this to the patient she is expressed an understanding. I do not think this will significantly change her postoperative course. We have again gone over the risks of surgery and all her questions were encouraged.

## 2017-02-03 ENCOUNTER — Encounter (HOSPITAL_COMMUNITY): Payer: Self-pay | Admitting: Orthopedic Surgery

## 2017-02-03 DIAGNOSIS — Z6841 Body Mass Index (BMI) 40.0 and over, adult: Secondary | ICD-10-CM | POA: Diagnosis not present

## 2017-02-03 DIAGNOSIS — F419 Anxiety disorder, unspecified: Secondary | ICD-10-CM | POA: Diagnosis not present

## 2017-02-03 DIAGNOSIS — M2578 Osteophyte, vertebrae: Secondary | ICD-10-CM | POA: Diagnosis not present

## 2017-02-03 DIAGNOSIS — E039 Hypothyroidism, unspecified: Secondary | ICD-10-CM | POA: Diagnosis not present

## 2017-02-03 DIAGNOSIS — Z79899 Other long term (current) drug therapy: Secondary | ICD-10-CM | POA: Diagnosis not present

## 2017-02-03 DIAGNOSIS — M5412 Radiculopathy, cervical region: Secondary | ICD-10-CM | POA: Diagnosis not present

## 2017-02-03 NOTE — Progress Notes (Signed)
    Subjective: Procedure(s) (LRB): ANTERIOR CERVICAL DECOMPRESSION/DISCECTOMY FUSION C5-6, C6-7 (N/A) 1 Day Post-Op  Patient reports pain as 2 on 0-10 scale.  Reports none arm pain reports incisional neck pain   Positive void Negative bowel movement Positive flatus Negative chest pain or shortness of breath  Objective: Vital signs in last 24 hours: Temp:  [97.6 F (36.4 C)-99.1 F (37.3 C)] 97.9 F (36.6 C) (11/02 0400) Pulse Rate:  [100-130] 104 (11/02 0400) Resp:  [10-20] 18 (11/02 0400) BP: (115-143)/(62-85) 115/67 (11/02 0400) SpO2:  [88 %-97 %] 92 % (11/02 0400)  Intake/Output from previous day: 11/01 0701 - 11/02 0700 In: 1456.3 [I.V.:1206.3; IV Piggyback:250] Out: 40 [Blood:40]  Labs: No results for input(s): WBC, RBC, HCT, PLT in the last 72 hours. No results for input(s): NA, K, CL, CO2, BUN, CREATININE, GLUCOSE, CALCIUM in the last 72 hours. No results for input(s): LABPT, INR in the last 72 hours.  Physical Exam: Neurologically intact ABD soft Intact pulses distally Incision: dressing C/D/I and no drainage Compartment soft no dysphagia or dysphonia  Assessment/Plan: Patient stable  xrays n/a Mobilization with physical therapy Encourage incentive spirometry Continue care  Advance diet Up with therapy  Doing well.  Plan on d/c to home later this AM  Melina Schools, [place] [phone]

## 2017-02-03 NOTE — Evaluation (Signed)
Occupational Therapy Evaluation and Discharge Patient Details Name: Alexis Knight MRN: [id number] DOB: May 02, 1960 Today's Date: 02/03/2017    History of Present Illness Pt is a 56 y/o female s/p C5-7 ACDF. PMH includes anxiety, R biceps tear, plantar fasciitis, and R chondromalcia patellae.    Clinical Impression   Pt reports she was independent with ADL PTA. Currently pt supervision with ADL and functional mobility. All neck, safety, and ADL education completed with pt. Pt planning to d/c home with supervision from family. No further acute OT needs identified; signing off at this time. Please re-consult if needs change. Thank you for this referral.     Follow Up Recommendations  No OT follow up;Supervision - Intermittent    Equipment Recommendations  None recommended by OT    Recommendations for Other Services       Precautions / Restrictions Precautions Precautions: Cervical Precaution Comments: Reviewed cervical precautions with pt.  Required Braces or Orthoses: Cervical Brace Cervical Brace: Hard collar Restrictions Weight Bearing Restrictions: No      Mobility Bed Mobility Overal bed mobility: Modified Independent             General bed mobility comments: for log roll technique  Transfers Overall transfer level: Needs assistance Equipment used: None Transfers: Sit to/from Stand Sit to Stand: Supervision         General transfer comment: for safety, no physical assist. No unsteadiness or LOB noted    Balance Overall balance assessment: No apparent balance deficits (not formally assessed)                                         ADL either performed or assessed with clinical judgement   ADL Overall ADL's : Needs assistance/impaired Eating/Feeding: Independent;Sitting   Grooming: Set up;Oral care;Standing;Brushing hair Grooming Details (indicate cue type and reason): Educated on use of 2 cups for oral care Upper Body Bathing: Set  up;Sitting   Lower Body Bathing: Supervison/ safety;Sit to/from stand   Upper Body Dressing : Set up;Sitting   Lower Body Dressing: Supervision/safety;Sit to/from stand Lower Body Dressing Details (indicate cue type and reason): Good technique for LB dressing utilizing compensatory strategies Toilet Transfer: Supervision/safety;Ambulation;Regular Toilet           Functional mobility during ADLs: Supervision/safety General ADL Comments: Educated pt on maintaining cervical precuations during functional activities, brace management and wear, log roll for bed mobility.     Vision         Perception     Praxis      Pertinent Vitals/Pain Pain Assessment: Faces Faces Pain Scale: Hurts little more Pain Location: neck Pain Descriptors / Indicators: Sore Pain Intervention(s): Monitored during session;Repositioned     Hand Dominance Left   Extremity/Trunk Assessment Upper Extremity Assessment Upper Extremity Assessment: Overall WFL for tasks assessed   Lower Extremity Assessment Lower Extremity Assessment: Defer to PT evaluation   Cervical / Trunk Assessment Cervical / Trunk Assessment: Other exceptions Cervical / Trunk Exceptions: s/p acdf    Communication Communication Communication: No difficulties   Cognition Arousal/Alertness: Awake/alert Behavior During Therapy: WFL for tasks assessed/performed Overall Cognitive Status: Within Functional Limits for tasks assessed                                     General Comments  Exercises     Shoulder Instructions      Home Living Family/patient expects to be discharged to:: Private residence Living Arrangements: Spouse/significant other Available Help at Discharge: Family;Available 24 hours/day Type of Home: House Home Access: Stairs to enter [employer] of Steps: 2 Entrance Stairs-Rails: None Home Layout: One level     Bathroom Shower/Tub: Insurance underwriter: Standard     Home Equipment: Shower seat          Prior Functioning/Environment Level of Independence: Independent                 OT Problem List:        OT Treatment/Interventions:      OT Goals(Current goals can be found in the care plan section) Acute Rehab OT Goals Patient Stated Goal: to go home  OT Goal Formulation: All assessment and education complete, DC therapy  OT Frequency:     Barriers to D/C:            Co-evaluation              AM-PAC PT "6 Clicks" Daily Activity     Outcome Measure Help from another person eating meals?: None Help from another person taking care of personal grooming?: None Help from another person toileting, which includes using toliet, bedpan, or urinal?: A Little Help from another person bathing (including washing, rinsing, drying)?: A Little Help from another person to put on and taking off regular upper body clothing?: None Help from another person to put on and taking off regular lower body clothing?: A Little 6 Click Score: 21   End of Session Equipment Utilized During Treatment: Cervical collar Nurse Communication: Mobility status;Other (comment) (no eqipment or f/u needs)  Activity Tolerance: Patient tolerated treatment well Patient left: in chair;with call bell/phone within reach;Other (comment) (PT present in room)  OT Visit Diagnosis: Pain Pain - part of body:  (neck)                Time: 2841-3244 OT Time Calculation (min): 14 min Charges:  OT General Charges $OT Visit: 1 Visit OT Evaluation $OT Eval Low Complexity: 1 Low G-Codes: OT G-codes **NOT FOR INPATIENT CLASS** Functional Assessment Tool Used: Clinical judgement Functional Limitation: Self care Self Care Current Status (W1027): At least 1 percent but less than 20 percent impaired, limited or restricted Self Care Goal Status (O5366): At least 1 percent but less than 20 percent impaired, limited or restricted Self Care Discharge Status  [phone]): At least 1 percent but less than 20 percent impaired, limited or restricted   Mel Almond A. Ulice Brilliant, M.S., OTR/L Pager: [place] 02/03/2017, 8:37 AM

## 2017-02-03 NOTE — Discharge Instructions (Signed)

## 2017-02-03 NOTE — Progress Notes (Signed)
Physical Therapy Treatment and Discharge Patient Details Name: Alexis Knight MRN: [id number] DOB: 1960-05-28 Today's Date: 02/03/2017    History of Present Illness Pt is a 56 y/o female s/p C5-7 ACDF. PMH includes anxiety, R biceps tear, plantar fasciitis, and R chondromalcia patellae.     PT Comments    Pt progressing towards physical therapy goals. Was able to perform transfers and ambulation with gross modified independence. Pt anticipates d/c home this morning. She was educated on walking program, safe progression of activity, precautions, brace wearing schedule, and positioning during periods of rest. Pt has met all acute PT goals, and declined stair training. Will sign off at this time. If needs change, please reconsult.   Follow Up Recommendations  No PT follow up;Supervision for mobility/OOB     Equipment Recommendations  None recommended by PT    Recommendations for Other Services       Precautions / Restrictions Precautions Precautions: Cervical Precaution Comments: Reviewed cervical precautions with pt.  Required Braces or Orthoses: Cervical Brace Cervical Brace: Hard collar Restrictions Weight Bearing Restrictions: No    Mobility  Bed Mobility Overal bed mobility: Modified Independent             General bed mobility comments: Pt received sitting up in recliner upon PT arrival.   Transfers Overall transfer level: Modified independent Equipment used: None Transfers: Sit to/from Stand Sit to Stand: Supervision         General transfer comment: No physical assist required. Pt demonstrated proper hand placement on seated surface for safety.   Ambulation/Gait Ambulation/Gait assistance: Modified independent (Device/Increase time) Ambulation Distance (Feet): 250 Feet Assistive device: None Gait Pattern/deviations: Step-through pattern;Decreased stride length Gait velocity: Decreased   General Gait Details: Pt able to demonstrate a mildly slow but  steady gait. Pt appeared casual, with her hands in her pockets. No assistance required and no LOB noted. Pt declined stair training, and states it is "two small steps, and my husband will be there to help me". Feel she is moving well enough where deferral of stair training is reasonable.    Stairs            Wheelchair Mobility    Modified Rankin (Stroke Patients Only)       Balance Overall balance assessment: No apparent balance deficits (not formally assessed) Sitting-balance support: No upper extremity supported;Feet supported Sitting balance-Leahy Scale: Good     Standing balance support: No upper extremity supported;During functional activity Standing balance-Leahy Scale: Fair                              Cognition Arousal/Alertness: Awake/alert Behavior During Therapy: WFL for tasks assessed/performed Overall Cognitive Status: Within Functional Limits for tasks assessed                                        Exercises      General Comments        Pertinent Vitals/Pain Pain Assessment: Faces Faces Pain Scale: Hurts a little bit Pain Location: neck Pain Descriptors / Indicators: Sore Pain Intervention(s): Monitored during session    Home Living Family/patient expects to be discharged to:: Private residence Living Arrangements: Spouse/significant other Available Help at Discharge: Family;Available 24 hours/day Type of Home: House Home Access: Stairs to enter Entrance Stairs-Rails: None Home Layout: One level Home Equipment: Civil engineer, contracting  Prior Function Level of Independence: Independent          PT Goals (current goals can now be found in the care plan section) Acute Rehab PT Goals Patient Stated Goal: to go home  PT Goal Formulation: With patient Time For Goal Achievement: 02/09/17 Potential to Achieve Goals: Good Progress towards PT goals: Goals met/education completed, patient discharged from PT     Frequency    Min 5X/week      PT Plan Current plan remains appropriate    Co-evaluation              AM-PAC PT "6 Clicks" Daily Activity  Outcome Measure  Difficulty turning over in bed (including adjusting bedclothes, sheets and blankets)?: None Difficulty moving from lying on back to sitting on the side of the bed? : None Difficulty sitting down on and standing up from a chair with arms (e.g., wheelchair, bedside commode, etc,.)?: None Help needed moving to and from a bed to chair (including a wheelchair)?: None Help needed walking in hospital room?: None Help needed climbing 3-5 steps with a railing? : None 6 Click Score: 24    End of Session Equipment Utilized During Treatment: Gait belt;Cervical collar Activity Tolerance: Patient tolerated treatment well Patient left: in bed;with call bell/phone within reach;with family/visitor present Nurse Communication: Mobility status PT Visit Diagnosis: Unsteadiness on feet (R26.81);Pain Pain - part of body:  (neck )     Time: 7096-2836 PT Time Calculation (min) (ACUTE ONLY): 14 min  Charges:  $Gait Training: 8-22 mins                    G Codes:       Rolinda Roan, PT, DPT Acute Rehabilitation Services Pager: [phone]    Thelma Comp 02/03/2017, 9:43 AM

## 2017-02-03 NOTE — Progress Notes (Signed)
Discharged instructions/education/Rx/AVS given to patient with husband at bedside and they both verbalized understanding. Patient swallowing well. No drainage, no swelling, no redness noted on incision site. Pain is mild to moderate and controlled by PRN meds. MAE well and patient walking in the halway with no problem. Voiding and emptying bladder well. D/C via wheelchair.

## 2017-02-07 NOTE — Discharge Summary (Signed)
Physician Discharge Summary  Patient ID: Alexis Knight MRN: [id number] DOB/AGE: 12-14-1960 56 y.o.  Admit date: 02/02/2017 Discharge date: 02/03/17  Admission Diagnoses:  Cervical DDD  Discharge Diagnoses:  Active Problems:   S/P cervical spinal fusion   Past Medical History:  Diagnosis Date  . Anxiety   . Headache(784.0)    migraines hx  . Hypothyroidism   . Nasal sinus congestion   . PONV (postoperative nausea and vomiting)     Surgeries: Procedure(s): ANTERIOR CERVICAL DECOMPRESSION/DISCECTOMY FUSION C5-6, C6-7 on 02/02/2017   Consultants (if any):   Discharged Condition: Improved  Hospital Course: Alexis Knight is an 56 y.o. female who was admitted 02/02/2017 with a diagnosis of Cervical DDD and went to the operating room on 02/02/2017 and underwent the above named procedures.  Post op day one pt reports mild pain controlled on oral medication.  Pt is urinating w/o difficulty. Pt has been ambulating in hallway. Pt cleared by PT for DC.    She was given perioperative antibiotics:  Anti-infectives (From admission, onward)   Start     Dose/Rate Route Frequency Ordered Stop   02/02/17 1930  vancomycin (VANCOCIN) 1,500 mg in sodium chloride 0.9 % 500 mL IVPB     1,500 mg 250 mL/hr over 120 Minutes Intravenous  Once 02/02/17 1339 02/02/17 2025   02/02/17 0542  vancomycin (VANCOCIN) IVPB 1000 mg/200 mL premix     1,000 mg 200 mL/hr over 60 Minutes Intravenous 60 min pre-op 02/02/17 0542 02/02/17 0815    .  She was given sequential compression devices, early ambulation, and TED for DVT prophylaxis.  She benefited maximally from the hospital stay and there were no complications.    Recent vital signs:  Vitals:   02/03/17 0400 02/03/17 0801  BP: 115/67 (!) 106/94  Pulse: (!) 104 98  Resp: 18 18  Temp: 97.9 F (36.6 C) 98.2 F (36.8 C)  SpO2: 92% 93%    Recent laboratory studies:  Lab Results  Component Value Date   HGB 12.5 01/25/2017   HGB 12.3 12/23/2016   HGB 13.5 01/11/2012   Lab Results  Component Value Date   WBC 9.5 01/25/2017   PLT 339 01/25/2017   Lab Results  Component Value Date   INR 1.0 01/01/2008   Lab Results  Component Value Date   NA 138 01/25/2017   K 3.5 01/25/2017   CL 104 01/25/2017   CO2 25 01/25/2017   BUN 8 01/25/2017   CREATININE 0.62 01/25/2017   GLUCOSE 111 (H) 01/25/2017    Discharge Medications:   Allergies as of 02/03/2017      Reactions   Penicillins Shortness Of Breath, Other (See Comments)   Pt CAN take amoxicillin and Keflex with no problems   Metronidazole Hives   Sulfa Antibiotics Rash      Medication List    TAKE these medications   baclofen 10 MG tablet Commonly known as:  LIORESAL Take 1 tablet (10 mg total) by mouth 3 (three) times daily.   buPROPion 150 MG 12 hr tablet Commonly known as:  WELLBUTRIN SR Take 1 tablet (150 mg total) by mouth daily.   cetirizine 10 MG tablet Commonly known as:  ZYRTEC Take 10 mg by mouth daily.   levothyroxine 112 MCG tablet Commonly known as:  SYNTHROID, LEVOTHROID Take 112 mcg by mouth daily before breakfast.   ondansetron 4 MG disintegrating tablet Commonly known as:  ZOFRAN ODT Take 1 tablet (4 mg total) by mouth every 8 (eight) hours as  needed for nausea or vomiting.   oxyCODONE-acetaminophen 10-325 MG tablet Commonly known as:  PERCOCET Take 1 tablet by mouth every 4 (four) hours as needed for pain.   sertraline 50 MG tablet Commonly known as:  ZOLOFT Take 50 mg by mouth daily.   TURMERIC PO Take 1 capsule by mouth daily.       Diagnostic Studies: Dg Cervical Spine 2-3 Views  Result Date: 02/02/2017 CLINICAL DATA:  Anterior cervical discectomy C5-C7 EXAM: CERVICAL SPINE - 2-3 VIEW; DG C-ARM 61-120 MIN COMPARISON:  None FINDINGS: Three intraoperative spot images demonstrate 2 level ACDF from C5-C7. C7 is difficult to visualize due to overlying shoulders. No visible hardware complicating feature. IMPRESSION: C5-C7 ACDF.  No  visible complicating features. Electronically Signed   By: Rolm Baptise M.D.   On: 02/02/2017 11:27   Dg C-arm 61-120 Min  Result Date: 02/02/2017 CLINICAL DATA:  Anterior cervical discectomy C5-C7 EXAM: CERVICAL SPINE - 2-3 VIEW; DG C-ARM 61-120 MIN COMPARISON:  None FINDINGS: Three intraoperative spot images demonstrate 2 level ACDF from C5-C7. C7 is difficult to visualize due to overlying shoulders. No visible hardware complicating feature. IMPRESSION: C5-C7 ACDF.  No visible complicating features. Electronically Signed   By: Rolm Baptise M.D.   On: 02/02/2017 11:27    Disposition: 01-Home or Self Care Pt will present to clinic in 2 weeks  Post op medications provided  Discharge Instructions    Incentive spirometry RT   Complete by:  As directed       Follow-up Information    Melina Schools, MD Follow up in 2 week(s).   Specialty:  Orthopedic Surgery Contact information: [address] [place] [address] [phone]            Signed: Valinda Hoar 02/07/2017, 7:53 AM

## 2017-02-15 ENCOUNTER — Encounter (HOSPITAL_COMMUNITY): Payer: Self-pay | Admitting: Orthopedic Surgery

## 2017-03-20 DIAGNOSIS — M50323 Other cervical disc degeneration at C6-C7 level: Secondary | ICD-10-CM | POA: Diagnosis not present

## 2017-03-20 DIAGNOSIS — M50322 Other cervical disc degeneration at C5-C6 level: Secondary | ICD-10-CM | POA: Diagnosis not present

## 2017-03-30 DIAGNOSIS — M542 Cervicalgia: Secondary | ICD-10-CM | POA: Diagnosis not present

## 2017-05-01 DIAGNOSIS — M50323 Other cervical disc degeneration at C6-C7 level: Secondary | ICD-10-CM | POA: Diagnosis not present

## 2017-05-09 DIAGNOSIS — R002 Palpitations: Secondary | ICD-10-CM | POA: Diagnosis not present

## 2017-05-09 DIAGNOSIS — I499 Cardiac arrhythmia, unspecified: Secondary | ICD-10-CM | POA: Diagnosis not present

## 2017-05-10 ENCOUNTER — Other Ambulatory Visit: Payer: Self-pay | Admitting: Internal Medicine

## 2017-05-10 ENCOUNTER — Telehealth (HOSPITAL_COMMUNITY): Payer: Self-pay | Admitting: Internal Medicine

## 2017-05-10 DIAGNOSIS — I499 Cardiac arrhythmia, unspecified: Secondary | ICD-10-CM

## 2017-05-10 NOTE — Telephone Encounter (Signed)
Did not need to open encounter 

## 2017-05-12 ENCOUNTER — Other Ambulatory Visit: Payer: Self-pay

## 2017-05-12 ENCOUNTER — Encounter (INDEPENDENT_AMBULATORY_CARE_PROVIDER_SITE_OTHER): Payer: Self-pay

## 2017-05-12 ENCOUNTER — Ambulatory Visit (HOSPITAL_COMMUNITY): Payer: Federal, State, Local not specified - PPO | Attending: Cardiovascular Disease

## 2017-05-12 DIAGNOSIS — Z6841 Body Mass Index (BMI) 40.0 and over, adult: Secondary | ICD-10-CM | POA: Diagnosis not present

## 2017-05-12 DIAGNOSIS — I517 Cardiomegaly: Secondary | ICD-10-CM | POA: Diagnosis not present

## 2017-05-12 DIAGNOSIS — Z87891 Personal history of nicotine dependence: Secondary | ICD-10-CM | POA: Insufficient documentation

## 2017-05-12 DIAGNOSIS — Z8249 Family history of ischemic heart disease and other diseases of the circulatory system: Secondary | ICD-10-CM | POA: Diagnosis not present

## 2017-05-12 DIAGNOSIS — I340 Nonrheumatic mitral (valve) insufficiency: Secondary | ICD-10-CM | POA: Diagnosis not present

## 2017-05-12 DIAGNOSIS — R002 Palpitations: Secondary | ICD-10-CM | POA: Insufficient documentation

## 2017-05-12 DIAGNOSIS — I499 Cardiac arrhythmia, unspecified: Secondary | ICD-10-CM | POA: Insufficient documentation

## 2017-05-12 DIAGNOSIS — E669 Obesity, unspecified: Secondary | ICD-10-CM | POA: Insufficient documentation

## 2017-05-12 DIAGNOSIS — G43909 Migraine, unspecified, not intractable, without status migrainosus: Secondary | ICD-10-CM | POA: Insufficient documentation

## 2017-05-22 DIAGNOSIS — Z808 Family history of malignant neoplasm of other organs or systems: Secondary | ICD-10-CM | POA: Diagnosis not present

## 2017-05-22 DIAGNOSIS — I493 Ventricular premature depolarization: Secondary | ICD-10-CM | POA: Diagnosis not present

## 2017-05-22 DIAGNOSIS — E042 Nontoxic multinodular goiter: Secondary | ICD-10-CM | POA: Diagnosis not present

## 2017-05-22 DIAGNOSIS — R002 Palpitations: Secondary | ICD-10-CM | POA: Diagnosis not present

## 2017-06-12 DIAGNOSIS — M50323 Other cervical disc degeneration at C6-C7 level: Secondary | ICD-10-CM | POA: Diagnosis not present

## 2017-06-12 DIAGNOSIS — Z4889 Encounter for other specified surgical aftercare: Secondary | ICD-10-CM | POA: Diagnosis not present

## 2017-06-16 ENCOUNTER — Telehealth: Payer: Self-pay | Admitting: *Deleted

## 2017-06-16 NOTE — Telephone Encounter (Signed)
Referral sent to scheduling. 

## 2017-06-26 DIAGNOSIS — Z6841 Body Mass Index (BMI) 40.0 and over, adult: Secondary | ICD-10-CM | POA: Diagnosis not present

## 2017-06-26 DIAGNOSIS — Z1231 Encounter for screening mammogram for malignant neoplasm of breast: Secondary | ICD-10-CM | POA: Diagnosis not present

## 2017-06-28 DIAGNOSIS — I519 Heart disease, unspecified: Secondary | ICD-10-CM | POA: Diagnosis not present

## 2017-06-28 DIAGNOSIS — I493 Ventricular premature depolarization: Secondary | ICD-10-CM | POA: Diagnosis not present

## 2017-06-28 DIAGNOSIS — R002 Palpitations: Secondary | ICD-10-CM | POA: Diagnosis not present

## 2017-07-24 ENCOUNTER — Ambulatory Visit: Payer: Federal, State, Local not specified - PPO | Admitting: Cardiology

## 2017-08-03 ENCOUNTER — Ambulatory Visit (INDEPENDENT_AMBULATORY_CARE_PROVIDER_SITE_OTHER): Payer: Federal, State, Local not specified - PPO | Admitting: Cardiology

## 2017-08-03 ENCOUNTER — Encounter: Payer: Self-pay | Admitting: Cardiology

## 2017-08-03 VITALS — BP 120/68 | HR 88 | Ht 60.0 in | Wt 228.0 lb

## 2017-08-03 DIAGNOSIS — I493 Ventricular premature depolarization: Secondary | ICD-10-CM | POA: Diagnosis not present

## 2017-08-03 DIAGNOSIS — R002 Palpitations: Secondary | ICD-10-CM | POA: Diagnosis not present

## 2017-08-03 NOTE — Patient Instructions (Signed)
Medication Instructions:  Your physician recommends that you continue on your current medications as directed. Please refer to the Current Medication list given to you today.  Labwork: None ordered  Testing/Procedures: Your physician has recommended that you wear a holter monitor. Holter monitors are medical devices that record the heart's electrical activity. Doctors most often use these monitors to diagnose arrhythmias. Arrhythmias are problems with the speed or rhythm of the heartbeat. The monitor is a small, portable device. You can wear one while you do your normal daily activities. This is usually used to diagnose what is causing palpitations/syncope (passing out).    Follow-Up: Your physician recommends that you schedule a follow-up appointment in: 1-2 WEEKS AFTER MONITOR IS PUT ON WITH BRITTANY SIMMONS, PA-C   Any Other Special Instructions Will Be Listed Below (If Applicable).   Holter Monitoring A Holter monitor is a small device that is used to detect abnormal heart rhythms. It clips to your clothing and is connected by wires to flat, sticky disks (electrodes) that attach to your chest. It is worn continuously for 24-48 hours. Follow these instructions at home:  Wear your Holter monitor at all times, even while exercising and sleeping, for as long as directed by your health care provider.  Make sure that the Holter monitor is safely clipped to your clothing or close to your body as recommended by your health care provider.  Do not get the monitor or wires wet.  Do not put body lotion or moisturizer on your chest.  Keep your skin clean.  Keep a diary of your daily activities, such as walking and doing chores. If you feel that your heartbeat is abnormal or that your heart is fluttering or skipping a beat: ? Record what you are doing when it happens. ? Record what time of day the symptoms occur.  Return your Holter monitor as directed by your health care provider.  Keep  all follow-up visits as directed by your health care provider. This is important. Get help right away if:  You feel lightheaded or you faint.  You have trouble breathing.  You feel pain in your chest, upper arm, or jaw.  You feel sick to your stomach and your skin is pale, cool, or damp.  You heartbeat feels unusual or abnormal. This information is not intended to replace advice given to you by your health care provider. Make sure you discuss any questions you have with your health care provider. Document Released: 12/18/2003 Document Revised: 08/27/2015 Document Reviewed: 10/28/2013 Elsevier Interactive Patient Education  [employer].    If you need a refill on your cardiac medications before your next appointment, please call your pharmacy.

## 2017-08-03 NOTE — Progress Notes (Signed)
08/03/2017 Frady Gift   May 20, 1960  [id number]  Primary Physician Josetta Huddle, MD Primary Cardiologist: New (Dr. Johnsie Cancel, [place])  Reason for Visit/CC: New Pt Eval for Palpitations and PVCs  HPI:  Alexis Knight is a 57 y.o. female who presents to clinic today as a new patient, for evaluation for palpitations and PVCs.  She was referred by her PCP, Dr. Inda Merlin at [facility].  She has no prior cardiac history.  Cardiac risk factors include family history of CAD.  Her father has had 2 MIs, in his early 76s.  She has a history of obesity.  She has a remote history of partial thyroidectomy secondary to thyroid nodules.  She is on Synthroid.  Dr. Inda Merlin follows this closely and recent TSH was within normal limits.  She denies any prior history of hypertension, hyperlipidemia or tobacco use.  She reports history of gestational diabetes and this has been followed closely by Dr. Inda Merlin and she denies any history of type 2 diabetes.  For the past 3 months, she has been having issues with palpitations which seems to be related to high stress levels.  Her symptoms primarily occur while at work.  She reports that she works in a high stress environment.  She works for [employer] and was under a lot of stress during [employer] shutdown.  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  She reports that she has cut down significantly on caffeine intake.  She drinks only one cup of coffee daily.  At the start of her symptoms in February, Dr. Inda Merlin obtained basic laboratory work including TSH and potassium levels, both of which were within normal limits.  Hemoglobin was also normal.  Dr. Inda Merlin ordered an echocardiogram which showed normal left ventricular EF at 50%.  Grade 2 diastolic dysfunction was noted.  No WMA.  There was mild MR.  The left atrium was mildly dilated.  Dr. Inda Merlin started her on metoprolol, initially at 25 mg daily but ultimately increased this to 50 mg daily.  Today in  clinic, she reports some improvement with metoprolol but continues to have palpitations daily while at work.  She denies any other symptoms with her palpitations.  No chest pain, dyspnea, dizziness, lightheadedness or syncope/near syncope.  She also denies orthopnea, PND, lower extremity edema and snoring.  EKG in clinic today shows normal sinus rhythm with 2 PVCs.  Heart rate in the 80s.  Blood pressure is 120/68.   Cardiac Studies 2D Echo 05/12/17 Study Conclusions  - Left ventricle: The cavity size was normal. Systolic function was   at the lower limits of normal. The estimated ejection fraction   was = 50%. Wall motion was normal; there were no regional wall   motion abnormalities. Features are consistent with a pseudonormal   left ventricular filling pattern, with concomitant abnormal   relaxation and increased filling pressure (grade 2 diastolic   dysfunction). - Mitral valve: There was mild regurgitation. - Left atrium: The atrium was mildly dilated.   Current Meds  Medication Sig  . aspirin 81 MG chewable tablet Chew 1 tablet by mouth daily.  . cetirizine (ZYRTEC) 10 MG tablet Take 10 mg by mouth daily.  [name] Kitchen levothyroxine (SYNTHROID, LEVOTHROID) 112 MCG tablet Take 112 mcg by mouth daily before breakfast.   . metoprolol succinate (TOPROL-XL) 50 MG 24 hr tablet Take 1 tablet by mouth daily.  . sertraline (ZOLOFT) 50 MG tablet Take 50 mg by mouth daily.  . TURMERIC  PO Take 1 capsule by mouth daily.   Allergies  Allergen Reactions  . Penicillins Shortness Of Breath and Other (See Comments)    Pt CAN take amoxicillin and Keflex with no problems  . Metronidazole Hives  . Sulfa Antibiotics Rash   Past Medical History:  Diagnosis Date  . Anxiety   . Headache(784.0)    migraines hx  . Hypothyroidism   . Nasal sinus congestion   . PONV (postoperative nausea and vomiting)    History reviewed. No pertinent family history. Past Surgical History:  Procedure Laterality Date  .  ABDOMINAL HYSTERECTOMY    . ANTERIOR CERVICAL DECOMP/DISCECTOMY FUSION N/A 02/02/2017   Procedure: ANTERIOR CERVICAL DECOMPRESSION/DISCECTOMY FUSION C5-6, C6-7;  Surgeon: Melina Schools, MD;  Location: [place];  Service: Orthopedics;  Laterality: N/A;  . BLADDER SUSPENSION  01/17/2012   Procedure: TRANSVAGINAL TAPE (TVT) PROCEDURE;  Surgeon: Olga Millers, MD;  Location: [facility];  Service: Gynecology;  Laterality: N/A;  with cystoscopy and excision of hemmorhoid  . CESAREAN SECTION    . SHOULDER ARTHROSCOPY Left   . THYROIDECTOMY, PARTIAL    . TONSILLECTOMY     Social History   Socioeconomic History  . Marital status: Married    Spouse name: Not on file  . Number of children: Not on file  . Years of education: Not on file  . Highest education level: Not on file  Occupational History  . Not on file  Social Needs  . Financial resource strain: Not on file  . Food insecurity:    Worry: Not on file    Inability: Not on file  . Transportation needs:    Medical: Not on file    Non-medical: Not on file  Tobacco Use  . Smoking status: Never Smoker  . Smokeless tobacco: Never Used  Substance and Sexual Activity  . Alcohol use: No  . Drug use: No  . Sexual activity: Not on file  Lifestyle  . Physical activity:    Days per week: Not on file    Minutes per session: Not on file  . Stress: Not on file  Relationships  . Social connections:    Talks on phone: Not on file    Gets together: Not on file    Attends religious service: Not on file    Active member of club or organization: Not on file    Attends meetings of clubs or organizations: Not on file    Relationship status: Not on file  . Intimate partner violence:    Fear of current or ex partner: Not on file    Emotionally abused: Not on file    Physically abused: Not on file    Forced sexual activity: Not on file  Other Topics Concern  . Not on file  Social History Narrative  . Not on file     Review of Systems: General:  negative for chills, fever, night sweats or weight changes.  Cardiovascular: negative for chest pain, dyspnea on exertion, edema, orthopnea, palpitations, paroxysmal nocturnal dyspnea or shortness of breath Dermatological: negative for rash Respiratory: negative for cough or wheezing Urologic: negative for hematuria Abdominal: negative for nausea, vomiting, diarrhea, bright red blood per rectum, melena, or hematemesis Neurologic: negative for visual changes, syncope, or dizziness All other systems reviewed and are otherwise negative except as noted above.   Physical Exam:  Blood pressure 120/68, pulse 88, height 5' (1.524 m), weight 228 lb (103.4 kg), SpO2 96 %.  General appearance: alert, cooperative, no distress  and moderately obese Neck: no carotid bruit and no JVD Lungs: clear to auscultation bilaterally Heart: regular rate and rhythm, S1, S2 normal, no murmur, click, rub or gallop Extremities: extremities normal, atraumatic, no cyanosis or edema Pulses: 2+ and symmetric Skin: Skin color, texture, turgor normal. No rashes or lesions Neurologic: Grossly normal  EKG NSR 2 PVCs -- personally reviewed   ASSESSMENT AND PLAN:   1.  PVCs: She is symptomatic with palpitations but denies any other symptoms.  Her PCP recently checked laboratory work including CBC, basic metabolic panel and thyroid function studies, all of which were within normal limits.  Echocardiogram also showed normal left ventricular EF with grade 2 diastolic dysfunction, normal wall motion, MR and mildly dilated left atrium.  She has been started on beta-blocker therapy with metoprolol by her PCP and notes some improvement but continues to have palpitations daily while at work, which she contributes to being stress related.  EKG today confirms the presence of PVCs.  I discussed case with Dr. Johnsie Cancel, DOD.  We have elected to further evaluate with a 48-hour cardiac monitor to assess PVC burden.  The patient will follow-up  after monitor has been resulted to determine further work-up/management.   2.  Diastolic dysfunction: Grade 2 diastolic dysfunction noted on recent echocardiogram.  She appears euvolemic on physical exam.  Her blood pressure is well controlled.  She has been started on beta-blocker therapy with metoprolol.  We will continue this.   Follow-Up after monitor.   Aldous Housel Ladoris Gene, MHS [facility] 08/03/2017 10:21 AM

## 2017-08-08 ENCOUNTER — Encounter: Payer: Self-pay | Admitting: Cardiology

## 2017-08-14 ENCOUNTER — Ambulatory Visit (INDEPENDENT_AMBULATORY_CARE_PROVIDER_SITE_OTHER): Payer: Federal, State, Local not specified - PPO

## 2017-08-14 DIAGNOSIS — I493 Ventricular premature depolarization: Secondary | ICD-10-CM | POA: Diagnosis not present

## 2017-08-14 DIAGNOSIS — R002 Palpitations: Secondary | ICD-10-CM

## 2017-08-14 DIAGNOSIS — Z4889 Encounter for other specified surgical aftercare: Secondary | ICD-10-CM | POA: Insufficient documentation

## 2017-08-14 DIAGNOSIS — M542 Cervicalgia: Secondary | ICD-10-CM | POA: Diagnosis not present

## 2017-08-23 ENCOUNTER — Telehealth: Payer: Self-pay

## 2017-08-23 ENCOUNTER — Telehealth (HOSPITAL_COMMUNITY): Payer: Self-pay | Admitting: Cardiovascular Disease

## 2017-08-23 DIAGNOSIS — R002 Palpitations: Secondary | ICD-10-CM

## 2017-08-23 DIAGNOSIS — I493 Ventricular premature depolarization: Secondary | ICD-10-CM

## 2017-08-23 MED ORDER — METOPROLOL SUCCINATE ER 50 MG PO TB24: 50.0000 mg | ORAL_TABLET | Freq: Two times a day (BID) | ORAL | 3 refills | Status: DC

## 2017-08-23 NOTE — Telephone Encounter (Signed)
-----   Message from Josue Hector, MD sent at 08/22/2017 11:30 AM EDT ----- PVCls are 11% beats refer to EP ? AAT for suppression Increase Toprol to 50 bid Needs exercise myovue to r/o CAD.

## 2017-08-23 NOTE — Telephone Encounter (Signed)
Called patient back about monitor results. Per Dr. Johnsie Cancel, PVCls are 11% beats refer to EP ? AAT for suppression Increase Toprol to 50 bid. Needs exercise myovue to r/o CAD. Patient verbalized understanding. Will send message to scheduling to help with getting patient appointment.

## 2017-08-24 ENCOUNTER — Telehealth (HOSPITAL_COMMUNITY): Payer: Self-pay | Admitting: *Deleted

## 2017-08-24 NOTE — Telephone Encounter (Signed)
User: Cherie Dark A Date/time: 08/23/17 1:53 PM  Comment: Called pt and was not able to lmsg on either number.[name] KitchenMarland KitchenVM were full.  Context:  Outcome: Left Message  Phone number: [phone] Phone Type: Mobile  Comm. type: Telephone Call type: Outgoing  Contact: Crew, [place] Relation to patient: Self

## 2017-08-24 NOTE — Telephone Encounter (Signed)
Patient given detailed instructions per Myocardial Perfusion Study Information Sheet for the test on 08/29/17. Patient notified to arrive 15 minutes early and that it is imperative to arrive on time for appointment to keep from having the test rescheduled.  If you need to cancel or reschedule your appointment, please call the office within 24 hours of your appointment. . Patient verbalized understanding. Alexis Knight    

## 2017-08-29 ENCOUNTER — Ambulatory Visit (HOSPITAL_COMMUNITY): Payer: Federal, State, Local not specified - PPO | Attending: Cardiovascular Disease

## 2017-08-29 ENCOUNTER — Ambulatory Visit: Payer: Federal, State, Local not specified - PPO | Admitting: Cardiology

## 2017-08-29 DIAGNOSIS — R002 Palpitations: Secondary | ICD-10-CM | POA: Diagnosis not present

## 2017-08-29 DIAGNOSIS — I493 Ventricular premature depolarization: Secondary | ICD-10-CM | POA: Insufficient documentation

## 2017-08-29 MED ORDER — TECHNETIUM TC 99M TETROFOSMIN IV KIT
32.8000 | PACK | Freq: Once | INTRAVENOUS | Status: AC | PRN
  Administered 2017-08-29: 32.8 via INTRAVENOUS
  Filled 2017-08-29: qty 33

## 2017-08-30 ENCOUNTER — Ambulatory Visit (HOSPITAL_COMMUNITY): Payer: Federal, State, Local not specified - PPO | Attending: Cardiovascular Disease

## 2017-08-30 LAB — MYOCARDIAL PERFUSION IMAGING
Estimated workload: 7.1 METS
Exercise duration (min): 5 min
LV dias vol: 83 mL (ref 46–106)
LV sys vol: 42 mL
MPHR: 166 {beats}/min
Peak HR: 160 {beats}/min
Percent HR: 96 %
RATE: 0.37
RPE: 18
Rest HR: 106 {beats}/min
SDS: 3
SRS: 7
SSS: 10
TID: 0.92

## 2017-08-30 MED ORDER — TECHNETIUM TC 99M TETROFOSMIN IV KIT
32.4000 | PACK | Freq: Once | INTRAVENOUS | Status: AC | PRN
  Administered 2017-08-30: 32.4 via INTRAVENOUS
  Filled 2017-08-30: qty 33

## 2017-08-31 ENCOUNTER — Telehealth: Payer: Self-pay | Admitting: Cardiovascular Disease

## 2017-08-31 NOTE — Telephone Encounter (Signed)
New message: ° ° ° °Pt is returning call. °

## 2017-08-31 NOTE — Telephone Encounter (Signed)
Called patient back with myoview results.

## 2017-09-01 DIAGNOSIS — B9789 Other viral agents as the cause of diseases classified elsewhere: Secondary | ICD-10-CM | POA: Diagnosis not present

## 2017-09-01 DIAGNOSIS — R0982 Postnasal drip: Secondary | ICD-10-CM | POA: Diagnosis not present

## 2017-09-01 DIAGNOSIS — J029 Acute pharyngitis, unspecified: Secondary | ICD-10-CM | POA: Diagnosis not present

## 2017-09-01 DIAGNOSIS — J069 Acute upper respiratory infection, unspecified: Secondary | ICD-10-CM | POA: Diagnosis not present

## 2017-09-04 DIAGNOSIS — J4521 Mild intermittent asthma with (acute) exacerbation: Secondary | ICD-10-CM | POA: Diagnosis not present

## 2017-09-07 ENCOUNTER — Ambulatory Visit: Payer: Federal, State, Local not specified - PPO | Admitting: Cardiology

## 2017-09-07 ENCOUNTER — Encounter: Payer: Self-pay | Admitting: Cardiology

## 2017-09-07 VITALS — BP 116/76 | HR 89 | Ht 60.0 in | Wt 226.6 lb

## 2017-09-07 DIAGNOSIS — I493 Ventricular premature depolarization: Secondary | ICD-10-CM

## 2017-09-07 DIAGNOSIS — I5032 Chronic diastolic (congestive) heart failure: Secondary | ICD-10-CM

## 2017-09-07 NOTE — Patient Instructions (Signed)
Medication Instructions:  Your physician recommends that you continue on your current medications as directed. Please refer to the Current Medication list given to you today.  Labwork: None ordered     *We will only notify you of abnormal results, otherwise continue current treatment plan.  Testing/Procedures: None ordered  Follow-Up: Your physician recommends that you schedule a follow-up appointment in: 3 months with Dr. Curt Bears.   * If you need a refill on your cardiac medications before your next appointment, please call your pharmacy.   *Please note that any paperwork needing to be filled out by the provider will need to be addressed at the front desk prior to seeing the provider. Please note that any FMLA, disability or other documents regarding health condition is subject to a $25.00 charge that must be received prior to completion of paperwork in the form of a money order or check.  Thank you for choosing [facility]!!   Trinidad Curet, RN [phone]

## 2017-09-07 NOTE — Progress Notes (Signed)
Electrophysiology Office Note   Date:  09/07/2017   ID:  Alexis Knight, DOB 08-06-1960, MRN [id number]  PCP:  Josetta Huddle, MD  Cardiologist:  Johnsie Cancel Primary Electrophysiologist:  Shyann Hefner Meredith Leeds, MD    No chief complaint on file.    History of Present Illness: Alexis Knight is a 57 y.o. female who is being seen today for the evaluation of PVCs at the request of [place]. Presenting today for electrophysiology evaluation.  She has a history of headaches, anxiety, and palpitations with PVCs.  The past 4 months she has been having issues with palpitations.  Palpitations were apparently related to high levels of stress.  Her symptoms occurred primarily while she was at work.  She was put on metoprolol which gave her symptomatic improvement, but she did continue to have palpitations daily while at work.  No chest pain, dizziness, dyspnea, lightheadedness, syncope, or near syncope.    Today, she denies symptoms of chest pain, shortness of breath, orthopnea, PND, lower extremity edema, claudication, dizziness, presyncope, syncope, bleeding, or neurologic sequela. The patient is tolerating medications without difficulties.  Her main symptoms are due to palpitations.  Palpitations occur at anytime of the day, though they occur most often when she is at work and seem to be exacerbated by stress.  There is no alleviating factor.  She is currently on metoprolol, with a recent dose increase which is greatly improved her symptoms.   Past Medical History:  Diagnosis Date  . Anxiety   . Headache(784.0)    migraines hx  . Hypothyroidism   . Nasal sinus congestion   . PONV (postoperative nausea and vomiting)    Past Surgical History:  Procedure Laterality Date  . ABDOMINAL HYSTERECTOMY    . ANTERIOR CERVICAL DECOMP/DISCECTOMY FUSION N/A 02/02/2017   Procedure: ANTERIOR CERVICAL DECOMPRESSION/DISCECTOMY FUSION C5-6, C6-7;  Surgeon: Melina Schools, MD;  Location: [place];  Service:  Orthopedics;  Laterality: N/A;  . BLADDER SUSPENSION  01/17/2012   Procedure: TRANSVAGINAL TAPE (TVT) PROCEDURE;  Surgeon: Olga Millers, MD;  Location: [facility];  Service: Gynecology;  Laterality: N/A;  with cystoscopy and excision of hemmorhoid  . CESAREAN SECTION    . SHOULDER ARTHROSCOPY Left   . THYROIDECTOMY, PARTIAL    . TONSILLECTOMY       Current Outpatient Medications  Medication Sig Dispense Refill  . albuterol (PROVENTIL HFA;VENTOLIN HFA) 108 (90 Base) MCG/ACT inhaler Inhale into the lungs as directed.    [name] Kitchen aspirin 81 MG chewable tablet Chew 1 tablet by mouth daily.    . cetirizine (ZYRTEC) 10 MG tablet Take 10 mg by mouth daily.    . fluticasone (FLONASE) 50 MCG/ACT nasal spray Place into both nostrils as directed.    . Fluticasone-Salmeterol (ADVAIR HFA IN) Inhale into the lungs as directed.    [name] Kitchen levothyroxine (SYNTHROID, LEVOTHROID) 112 MCG tablet Take 112 mcg by mouth daily before breakfast.     . metoprolol succinate (TOPROL-XL) 50 MG 24 hr tablet Take 1 tablet (50 mg total) by mouth 2 (two) times daily. Take with or immediately following a meal. 180 tablet 3  . predniSONE (STERAPRED UNI-PAK 21 TAB) 10 MG (21) TBPK tablet Take by mouth as directed.    . sertraline (ZOLOFT) 50 MG tablet Take 50 mg by mouth daily.    . TURMERIC PO Take 1 capsule by mouth daily.     No current facility-administered medications for this visit.     Allergies:   Penicillins; Metronidazole; and Sulfa antibiotics  Social History:  The patient  reports that she has never smoked. She has never used smokeless tobacco. She reports that she does not drink alcohol or use drugs.   Family History:  The patient's family history includes Atrial fibrillation in her father; Breast cancer in her paternal grandmother; Cervical cancer in her maternal grandmother; Dementia in her mother; Emphysema in her maternal grandfather; Heart attack in her father and maternal grandmother; Hyperlipidemia in her brother;  Hypertension in her brother; Prostate cancer in her maternal grandfather.    ROS:  Please see the history of present illness.   Otherwise, review of systems is positive for none.   All other systems are reviewed and negative.    PHYSICAL EXAM: VS:  BP 116/76   Pulse 89   Ht 5' (1.524 m)   Wt 226 lb 9.6 oz (102.8 kg)   SpO2 98%   BMI 44.25 kg/m  , BMI Body mass index is 44.25 kg/m. GEN: Well nourished, well developed, in no acute distress  HEENT: normal  Neck: no JVD, carotid bruits, or masses Cardiac: RRR; no murmurs, rubs, or gallops,no edema  Respiratory:  clear to auscultation bilaterally, normal work of breathing GI: soft, nontender, nondistended, + BS MS: no deformity or atrophy  Skin: warm and dry Neuro:  Strength and sensation are intact Psych: euthymic mood, full affect  EKG:  EKG is not ordered today. Personal review of the ekg ordered 08/03/17 shows SR, PVCs, LAE  Recent Labs: 01/25/2017: BUN 8; Creatinine, Ser 0.62; Hemoglobin 12.5; Platelets 339; Potassium 3.5; Sodium 138    Lipid Panel  No results found for: CHOL, TRIG, HDL, CHOLHDL, VLDL, LDLCALC, LDLDIRECT   Wt Readings from Last 3 Encounters:  09/07/17 226 lb 9.6 oz (102.8 kg)  08/29/17 228 lb (103.4 kg)  08/03/17 228 lb (103.4 kg)      Other studies Reviewed: Additional studies/ records that were reviewed today include: TTE 05/12/17  Review of the above records today demonstrates:  - Left ventricle: The cavity size was normal. Systolic function was   at the lower limits of normal. The estimated ejection fraction   was = 50%. Wall motion was normal; there were no regional wall   motion abnormalities. Features are consistent with a pseudonormal   left ventricular filling pattern, with concomitant abnormal   relaxation and increased filling pressure (grade 2 diastolic   dysfunction). - Mitral valve: There was mild regurgitation. - Left atrium: The atrium was mildly dilated.  Holter 08/22/17 -  personally reviewed NSR Average HR 93 bpm PVCls frequent 11% of beats Occasional bigeminy No significant NSVT  SPECT 08/30/17  Nuclear stress EF: 50%. The left ventricular ejection fraction is mildly decreased (45-54%).  This is a low risk study.  The study is normal. No evidence of ischemia or infarction   ASSESSMENT AND PLAN:  1.  PVCs: Been started on metoprolol which has given some benefit, though she still has palpitations.  He wore a cardiac monitor that showed 11% PVCs.  Her metoprolol dose was increased, which is greatly improved her symptoms.  She is having some fatigue and has cut her metoprolol in half in the evening which is helped with her fatigue.  At this point, she does not wish to have further medical therapy.  I Cylinda Santoli follow her up in 3 months.  She would be a candidate for flecainide versus ablation.  2.  Grade 2 diastolic dysfunction: No signs of volume overload.  Toprol.    Current medicines are  reviewed at length with the patient today.   The patient does not have concerns regarding her medicines.  The following changes were made today:  none  Labs/ tests ordered today include:  No orders of the defined types were placed in this encounter.    Disposition:   FU with Lawrencia Mauney 3 months  Signed, Lochlin Eppinger Meredith Leeds, MD  09/07/2017 1:17 PM     [address] [place] [address] [phone] (office) [phone] (fax)

## 2017-11-13 DIAGNOSIS — M542 Cervicalgia: Secondary | ICD-10-CM | POA: Diagnosis not present

## 2017-11-13 DIAGNOSIS — Z4889 Encounter for other specified surgical aftercare: Secondary | ICD-10-CM | POA: Diagnosis not present

## 2017-12-11 ENCOUNTER — Encounter: Payer: Self-pay | Admitting: *Deleted

## 2017-12-11 ENCOUNTER — Encounter: Payer: Self-pay | Admitting: Cardiology

## 2017-12-11 ENCOUNTER — Other Ambulatory Visit: Payer: Self-pay | Admitting: *Deleted

## 2017-12-11 ENCOUNTER — Ambulatory Visit: Payer: Federal, State, Local not specified - PPO | Admitting: Cardiology

## 2017-12-11 VITALS — BP 134/68 | HR 78 | Ht 60.0 in | Wt 233.0 lb

## 2017-12-11 DIAGNOSIS — Z8639 Personal history of other endocrine, nutritional and metabolic disease: Secondary | ICD-10-CM | POA: Insufficient documentation

## 2017-12-11 DIAGNOSIS — I5032 Chronic diastolic (congestive) heart failure: Secondary | ICD-10-CM

## 2017-12-11 DIAGNOSIS — I493 Ventricular premature depolarization: Secondary | ICD-10-CM

## 2017-12-11 MED ORDER — METOPROLOL SUCCINATE ER 100 MG PO TB24: 100.0000 mg | ORAL_TABLET | Freq: Every morning | ORAL | 3 refills | Status: DC

## 2017-12-11 NOTE — Addendum Note (Signed)
Addended by: Stanton Kidney on: 12/11/2017 03:09 PM   Modules accepted: Orders

## 2017-12-11 NOTE — Patient Instructions (Addendum)
Medication Instructions:  Your physician has recommended you make the following change in your medication:  1. CHANGE the way your take your Toprol  - increase your morning dose to 100 mg  - take your usual 25 mg in the evening  * If you need a refill on your cardiac medications before your next appointment, please call your pharmacy.   Labwork: None ordered  Testing/Procedures: None ordered  Follow-Up: Your physician recommends that you schedule a follow-up appointment in: 3 months with Dr. Curt Bears.   Thank you for choosing [facility]!!   Trinidad Curet, RN [phone]  Any Other Special Instructions Will Be Listed Below (If Applicable).

## 2017-12-11 NOTE — Progress Notes (Signed)
Electrophysiology Office Note   Date:  12/11/2017   ID:  Alexis Knight, DOB [birth year], MRN [id number]  PCP:  Josetta Huddle, MD  Cardiologist:  Johnsie Cancel Primary Electrophysiologist:  Syvanna Ciolino Meredith Leeds, MD    No chief complaint on file.    History of Present Illness: Alexis Knight is a 57 y.o. female who is being seen today for the evaluation of PVCs at the request of [place]. Presenting today for electrophysiology evaluation.  She has a history of headaches, anxiety, and palpitations with PVCs.  The past 4 months she has been having issues with palpitations.  Palpitations were apparently related to high levels of stress.  Her symptoms occurred primarily while she was at work.  She was put on metoprolol which gave her symptomatic improvement, but she did continue to have palpitations daily while at work.  No chest pain, dizziness, dyspnea, lightheadedness, syncope, or near syncope.  Today, denies symptoms of chest pain, shortness of breath, orthopnea, PND, lower extremity edema, claudication, dizziness, presyncope, syncope, bleeding, or neurologic sequela. The patient is tolerating medications without difficulties.  She is continued to have palpitations though they have improved with her Toprol-XL dose.  She feels that her palpitations are due to stress.  She is working overtime and also has to take care of her parents when she gets home from work.  She feels her palpitations mainly during the day and does not notice them at night.   Past Medical History:  Diagnosis Date  . Anxiety   . Headache(784.0)    migraines hx  . Hypothyroidism   . Nasal sinus congestion   . PONV (postoperative nausea and vomiting)    Past Surgical History:  Procedure Laterality Date  . ABDOMINAL HYSTERECTOMY    . ANTERIOR CERVICAL DECOMP/DISCECTOMY FUSION N/A 02/02/2017   Procedure: ANTERIOR CERVICAL DECOMPRESSION/DISCECTOMY FUSION C5-6, C6-7;  Surgeon: Melina Schools, MD;  Location: [facility];  Service:  Orthopedics;  Laterality: N/A;  . BLADDER SUSPENSION  01/17/2012   Procedure: TRANSVAGINAL TAPE (TVT) PROCEDURE;  Surgeon: Olga Millers, MD;  Location: [facility];  Service: Gynecology;  Laterality: N/A;  with cystoscopy and excision of hemmorhoid  . CESAREAN SECTION    . SHOULDER ARTHROSCOPY Left   . THYROIDECTOMY, PARTIAL    . TONSILLECTOMY       Current Outpatient Medications  Medication Sig Dispense Refill  . albuterol (PROVENTIL HFA;VENTOLIN HFA) 108 (90 Base) MCG/ACT inhaler Inhale into the lungs as directed.    [name] Kitchen aspirin 81 MG chewable tablet Chew 1 tablet by mouth daily.    . cetirizine (ZYRTEC) 10 MG tablet Take 10 mg by mouth daily.    . fluticasone (FLONASE) 50 MCG/ACT nasal spray Place into both nostrils as directed.    . Fluticasone-Salmeterol (ADVAIR HFA IN) Inhale into the lungs as directed.    [name] Kitchen levothyroxine (SYNTHROID, LEVOTHROID) 112 MCG tablet Take 112 mcg by mouth daily before breakfast.     . metoprolol succinate (TOPROL-XL) 25 MG 24 hr tablet Take 25 mg by mouth every evening.    . metoprolol succinate (TOPROL-XL) 50 MG 24 hr tablet Take 50 mg by mouth every morning. Take with or immediately following a meal.    . sertraline (ZOLOFT) 50 MG tablet Take 50 mg by mouth daily.    . TURMERIC PO Take 1 capsule by mouth daily.     No current facility-administered medications for this visit.     Allergies:   Penicillamine; Penicillins; Metronidazole; and Sulfa antibiotics   Social History:  The patient  reports that she has never smoked. She has never used smokeless tobacco. She reports that she does not drink alcohol or use drugs.   Family History:  The patient's family history includes Atrial fibrillation in her father; Breast cancer in her paternal grandmother; Cervical cancer in her maternal grandmother; Dementia in her mother; Emphysema in her maternal grandfather; Heart attack in her father and maternal grandmother; Hyperlipidemia in her brother; Hypertension in her  brother; Prostate cancer in her maternal grandfather.    ROS:  Please see the history of present illness.   Otherwise, review of systems is positive for palpitations.   All other systems are reviewed and negative.   PHYSICAL EXAM: VS:  BP 134/68   Pulse 78   Ht 5' (1.524 m)   Wt 233 lb (105.7 kg)   SpO2 97%   BMI 45.50 kg/m  , BMI Body mass index is 45.5 kg/m. GEN: Well nourished, well developed, in no acute distress  HEENT: normal  Neck: no JVD, carotid bruits, or masses Cardiac: RRR; no murmurs, rubs, or gallops,no edema  Respiratory:  clear to auscultation bilaterally, normal work of breathing GI: soft, nontender, nondistended, + BS MS: no deformity or atrophy  Skin: warm and dry Neuro:  Strength and sensation are intact Psych: euthymic mood, full affect  EKG:  EKG is not ordered today. Personal review of the ekg ordered 08/03/17 shows sinus rhythm, rate 88, PVCs  Recent Labs: 01/25/2017: BUN 8; Creatinine, Ser 0.62; Hemoglobin 12.5; Platelets 339; Potassium 3.5; Sodium 138    Lipid Panel  No results found for: CHOL, TRIG, HDL, CHOLHDL, VLDL, LDLCALC, LDLDIRECT   Wt Readings from Last 3 Encounters:  12/11/17 233 lb (105.7 kg)  09/07/17 226 lb 9.6 oz (102.8 kg)  08/29/17 228 lb (103.4 kg)      Other studies Reviewed: Additional studies/ records that were reviewed today include: TTE 05/12/17  Review of the above records today demonstrates:  - Left ventricle: The cavity size was normal. Systolic function was   at the lower limits of normal. The estimated ejection fraction   was = 50%. Wall motion was normal; there were no regional wall   motion abnormalities. Features are consistent with a pseudonormal   left ventricular filling pattern, with concomitant abnormal   relaxation and increased filling pressure (grade 2 diastolic   dysfunction). - Mitral valve: There was mild regurgitation. - Left atrium: The atrium was mildly dilated.  Holter 08/22/17 - personally  reviewed NSR Average HR 93 bpm PVCls frequent 11% of beats Occasional bigeminy No significant NSVT  SPECT 08/30/17  Nuclear stress EF: 50%. The left ventricular ejection fraction is mildly decreased (45-54%).  This is a low risk study.  The study is normal. No evidence of ischemia or infarction   ASSESSMENT AND PLAN:  1.  PVCs: On metoprolol which is given her some benefit, though she continues to have palpitations.  She is taking 50 mg in the morning and 25 mg of Toprol-XL on a daily basis.  Due to her symptoms, we Evaleen Sant plan to increase her morning dose.  We Jaleen Grupp plan for 100 mg in the morning and 25 mg in the evening of Toprol-XL  2.  Grade 2 diastolic dysfunction: No signs of volume overload  3.  Morbid obesity: Weight loss encouraged.  I did discuss with her the optimal exercise regimen of 150 minutes/week.  Current medicines are reviewed at length with the patient today.   The patient does not have  concerns regarding her medicines.  The following changes were made today: Increase morning dose of Toprol-XL  Labs/ tests ordered today include:  No orders of the defined types were placed in this encounter.    Disposition:   FU with Nilo Fallin 3 months  Signed, Adelaine Roppolo Meredith Leeds, MD  12/11/2017 2:55 PM     [facility] [address] [place] [address] [phone] (office) [phone] (fax)

## 2017-12-30 DIAGNOSIS — M79602 Pain in left arm: Secondary | ICD-10-CM | POA: Diagnosis not present

## 2017-12-30 DIAGNOSIS — M25512 Pain in left shoulder: Secondary | ICD-10-CM | POA: Diagnosis not present

## 2017-12-30 DIAGNOSIS — S46219A Strain of muscle, fascia and tendon of other parts of biceps, unspecified arm, initial encounter: Secondary | ICD-10-CM | POA: Insufficient documentation

## 2017-12-30 DIAGNOSIS — M542 Cervicalgia: Secondary | ICD-10-CM | POA: Diagnosis not present

## 2018-01-01 DIAGNOSIS — M25522 Pain in left elbow: Secondary | ICD-10-CM | POA: Diagnosis not present

## 2018-01-05 DIAGNOSIS — M25522 Pain in left elbow: Secondary | ICD-10-CM | POA: Diagnosis not present

## 2018-02-06 DIAGNOSIS — M25522 Pain in left elbow: Secondary | ICD-10-CM | POA: Insufficient documentation

## 2018-02-15 DIAGNOSIS — M25522 Pain in left elbow: Secondary | ICD-10-CM | POA: Diagnosis not present

## 2018-03-06 DIAGNOSIS — M25522 Pain in left elbow: Secondary | ICD-10-CM | POA: Diagnosis not present

## 2018-03-07 ENCOUNTER — Ambulatory Visit: Payer: Federal, State, Local not specified - PPO | Admitting: Cardiology

## 2018-04-06 ENCOUNTER — Ambulatory Visit: Payer: Federal, State, Local not specified - PPO | Admitting: Cardiology

## 2018-04-09 ENCOUNTER — Other Ambulatory Visit: Payer: Self-pay

## 2018-04-09 DIAGNOSIS — M67432 Ganglion, left wrist: Secondary | ICD-10-CM | POA: Diagnosis not present

## 2018-04-09 DIAGNOSIS — D1722 Benign lipomatous neoplasm of skin and subcutaneous tissue of left arm: Secondary | ICD-10-CM | POA: Diagnosis not present

## 2018-04-09 DIAGNOSIS — R2232 Localized swelling, mass and lump, left upper limb: Secondary | ICD-10-CM | POA: Diagnosis not present

## 2018-04-09 DIAGNOSIS — R2230 Localized swelling, mass and lump, unspecified upper limb: Secondary | ICD-10-CM | POA: Insufficient documentation

## 2018-04-20 DIAGNOSIS — R2232 Localized swelling, mass and lump, left upper limb: Secondary | ICD-10-CM | POA: Diagnosis not present

## 2018-04-20 DIAGNOSIS — Z5189 Encounter for other specified aftercare: Secondary | ICD-10-CM | POA: Diagnosis not present

## 2018-05-04 ENCOUNTER — Encounter: Payer: Self-pay | Admitting: Cardiology

## 2018-05-04 ENCOUNTER — Ambulatory Visit: Payer: Federal, State, Local not specified - PPO | Admitting: Cardiology

## 2018-05-04 VITALS — BP 120/76 | HR 104 | Ht 60.0 in | Wt 231.0 lb

## 2018-05-04 DIAGNOSIS — I493 Ventricular premature depolarization: Secondary | ICD-10-CM | POA: Diagnosis not present

## 2018-05-04 MED ORDER — METOPROLOL SUCCINATE ER 100 MG PO TB24: 100.0000 mg | ORAL_TABLET | Freq: Every morning | ORAL | 2 refills | Status: DC

## 2018-05-04 MED ORDER — METOPROLOL SUCCINATE ER 25 MG PO TB24: 25.0000 mg | ORAL_TABLET | Freq: Every evening | ORAL | 2 refills | Status: DC

## 2018-05-04 NOTE — Patient Instructions (Addendum)
Medication Instructions:  Your physician recommends that you continue on your current medications as directed. Please refer to the Current Medication list given to you today.  If you need a refill on your cardiac medications before your next appointment, please call your pharmacy.   Labwork: None ordered  Testing/Procedures: None ordered  Follow-Up: Your physician wants you to follow-up in: 6 months with Dr. Camnitz.  You will receive a reminder letter in the mail two months in advance. If you don't receive a letter, please call our office to schedule the follow-up appointment.  Thank you for choosing [facility]!!   Christ Fullenwider, RN [phone]         

## 2018-05-04 NOTE — Addendum Note (Signed)
Addended by: Stanton Kidney on: 05/04/2018 04:48 PM   Modules accepted: Orders

## 2018-05-04 NOTE — Progress Notes (Signed)
Electrophysiology Office Note   Date:  05/04/2018   ID:  Alexis Knight, DOB Jun 21, 1960, MRN [id number]  PCP:  Alexis Huddle, MD  Cardiologist:  Alexis Knight Primary Electrophysiologist:  Alexis Knight Alexis Leeds, MD    No chief complaint on file.    History of Present Illness: Alexis Knight is a 58 y.o. female who is being seen today for the evaluation of PVCs at the request of [place]. Presenting today for electrophysiology evaluation.  She has a history of headaches, anxiety, and palpitations with PVCs.  The past 4 months she has been having issues with palpitations.  Palpitations were apparently related to high levels of stress.  Her symptoms occurred primarily while she was at work.  She was put on metoprolol which gave her symptomatic improvement, but she did continue to have palpitations daily while at work.  No chest pain, dizziness, dyspnea, lightheadedness, syncope, or near syncope.  Today, denies symptoms of palpitations, chest pain, shortness of breath, orthopnea, PND, lower extremity edema, claudication, dizziness, presyncope, syncope, bleeding, or neurologic sequela. The patient is tolerating medications without difficulties.  Overall she is doing well.  She has had surgery on her left arm to remove lipomas.  She has been out of work for a month and has not noted palpitations.  She did go back to work and noted palpitations on her drive to work.  She feels that all of her palpitations are due to stress.   Past Medical History:  Diagnosis Date  . Anxiety   . Headache(784.0)    migraines hx  . Hypothyroidism   . Nasal sinus congestion   . PONV (postoperative nausea and vomiting)    Past Surgical History:  Procedure Laterality Date  . ABDOMINAL HYSTERECTOMY    . ANTERIOR CERVICAL DECOMP/DISCECTOMY FUSION N/A 02/02/2017   Procedure: ANTERIOR CERVICAL DECOMPRESSION/DISCECTOMY FUSION C5-6, C6-7;  Surgeon: Melina Schools, MD;  Location: [facility];  Service: Orthopedics;   Laterality: N/A;  . BLADDER SUSPENSION  01/17/2012   Procedure: TRANSVAGINAL TAPE (TVT) PROCEDURE;  Surgeon: Olga Millers, MD;  Location: [facility];  Service: Gynecology;  Laterality: N/A;  with cystoscopy and excision of hemmorhoid  . CESAREAN SECTION    . SHOULDER ARTHROSCOPY Left   . THYROIDECTOMY, PARTIAL    . TONSILLECTOMY       Current Outpatient Medications  Medication Sig Dispense Refill  . albuterol (PROVENTIL HFA;VENTOLIN HFA) 108 (90 Base) MCG/ACT inhaler Inhale into the lungs as directed.    [name] Kitchen aspirin 81 MG chewable tablet Chew 1 tablet by mouth daily.    . cetirizine (ZYRTEC) 10 MG tablet Take 10 mg by mouth daily.    . fluticasone (FLONASE) 50 MCG/ACT nasal spray Place into both nostrils as directed.    . Fluticasone-Salmeterol (ADVAIR HFA IN) Inhale into the lungs as directed.    [name] Kitchen levothyroxine (SYNTHROID, LEVOTHROID) 112 MCG tablet Take 112 mcg by mouth daily before breakfast.     . metoprolol succinate (TOPROL-XL) 100 MG 24 hr tablet Take 1 tablet (100 mg total) by mouth every morning. Take with or immediately following a meal. 90 tablet 3  . metoprolol succinate (TOPROL-XL) 25 MG 24 hr tablet Take 25 mg by mouth every evening.    . sertraline (ZOLOFT) 50 MG tablet Take 50 mg by mouth daily.    . TURMERIC PO Take 1 capsule by mouth daily.     No current facility-administered medications for this visit.     Allergies:   Penicillamine; Penicillins; Metronidazole; and Sulfa antibiotics  Social History:  The patient  reports that she has never smoked. She has never used smokeless tobacco. She reports that she does not drink alcohol or use drugs.   Family History:  The patient's family history includes Atrial fibrillation in her father; Breast cancer in her paternal grandmother; Cervical cancer in her maternal grandmother; Dementia in her mother; Emphysema in her maternal grandfather; Heart attack in her father and maternal grandmother; Hyperlipidemia in her brother;  Hypertension in her brother; Prostate cancer in her maternal grandfather.    ROS:  Please see the history of present illness.   Otherwise, review of systems is positive for none.   All other systems are reviewed and negative.   PHYSICAL EXAM: VS:  BP 120/76   Pulse (!) 104   Ht 5' (1.524 m)   Wt 231 lb (104.8 kg)   BMI 45.11 kg/m  , BMI Body mass index is 45.11 kg/m. GEN: Well nourished, well developed, in no acute distress  HEENT: normal  Neck: no JVD, carotid bruits, or masses Cardiac: RRR; no murmurs, rubs, or gallops,no edema  Respiratory:  clear to auscultation bilaterally, normal work of breathing GI: soft, nontender, nondistended, + BS MS: no deformity or atrophy  Skin: warm and dry Neuro:  Strength and sensation are intact Psych: euthymic mood, full affect  EKG:  EKG is ordered today. Personal review of the ekg ordered shows sinus rhythm, rate 104  Recent Labs: No results found for requested labs within last 8760 hours.    Lipid Panel  No results found for: CHOL, TRIG, HDL, CHOLHDL, VLDL, LDLCALC, LDLDIRECT   Wt Readings from Last 3 Encounters:  05/04/18 231 lb (104.8 kg)  12/11/17 233 lb (105.7 kg)  09/07/17 226 lb 9.6 oz (102.8 kg)      Other studies Reviewed: Additional studies/ records that were reviewed today include: TTE 05/12/17  Review of the above records today demonstrates:  - Left ventricle: The cavity size was normal. Systolic function was   at the lower limits of normal. The estimated ejection fraction   was = 50%. Wall motion was normal; there were no regional wall   motion abnormalities. Features are consistent with a pseudonormal   left ventricular filling pattern, with concomitant abnormal   relaxation and increased filling pressure (grade 2 diastolic   dysfunction). - Mitral valve: There was mild regurgitation. - Left atrium: The atrium was mildly dilated.  Holter 08/22/17 - personally reviewed NSR Average HR 93 bpm PVCls frequent 11%  of beats Occasional bigeminy No significant NSVT  SPECT 08/30/17  Nuclear stress EF: 50%. The left ventricular ejection fraction is mildly decreased (45-54%).  This is a low risk study.  The study is normal. No evidence of ischemia or infarction   ASSESSMENT AND PLAN:  1.  PVCs: Metoprolol which is giving her some benefit.  She is also been out of work and has not noted palpitations.  We Kadien Lineman thus continue her current dose.  She does go back to work on Monday.  If she has further episodes of palpitations, we Grisela Mesch plan to increase metoprolol.  2.  Grade 2 diastolic dysfunction: No signs of volume overload  3.  Morbid obesity: Weight loss encouraged  Current medicines are reviewed at length with the patient today.   The patient does not have concerns regarding her medicines.  The following changes were made today: None  Labs/ tests ordered today include:  Orders Placed This Encounter  Procedures  . EKG 12-Lead  Disposition:   FU with Samin Milke 6 months  Signed, Jadeyn Hargett Alexis Leeds, MD  05/04/2018 4:35 PM     [place] [address] [phone] (office) [phone] (fax)

## 2018-07-31 DIAGNOSIS — R2232 Localized swelling, mass and lump, left upper limb: Secondary | ICD-10-CM | POA: Diagnosis not present

## 2018-07-31 DIAGNOSIS — M25512 Pain in left shoulder: Secondary | ICD-10-CM | POA: Diagnosis not present

## 2018-08-09 DIAGNOSIS — S1086XA Insect bite of other specified part of neck, initial encounter: Secondary | ICD-10-CM | POA: Diagnosis not present

## 2018-08-13 DIAGNOSIS — K589 Irritable bowel syndrome without diarrhea: Secondary | ICD-10-CM | POA: Diagnosis not present

## 2018-08-13 DIAGNOSIS — M25512 Pain in left shoulder: Secondary | ICD-10-CM | POA: Diagnosis not present

## 2018-08-23 DIAGNOSIS — M25512 Pain in left shoulder: Secondary | ICD-10-CM | POA: Diagnosis not present

## 2018-08-23 DIAGNOSIS — R2232 Localized swelling, mass and lump, left upper limb: Secondary | ICD-10-CM | POA: Diagnosis not present

## 2018-08-31 DIAGNOSIS — M75102 Unspecified rotator cuff tear or rupture of left shoulder, not specified as traumatic: Secondary | ICD-10-CM | POA: Diagnosis not present

## 2018-09-25 DIAGNOSIS — H1045 Other chronic allergic conjunctivitis: Secondary | ICD-10-CM | POA: Diagnosis not present

## 2018-10-09 DIAGNOSIS — L821 Other seborrheic keratosis: Secondary | ICD-10-CM | POA: Diagnosis not present

## 2018-10-09 DIAGNOSIS — L718 Other rosacea: Secondary | ICD-10-CM | POA: Diagnosis not present

## 2018-10-09 DIAGNOSIS — L820 Inflamed seborrheic keratosis: Secondary | ICD-10-CM | POA: Diagnosis not present

## 2018-10-16 DIAGNOSIS — M75102 Unspecified rotator cuff tear or rupture of left shoulder, not specified as traumatic: Secondary | ICD-10-CM | POA: Diagnosis not present

## 2018-11-13 DIAGNOSIS — Z1231 Encounter for screening mammogram for malignant neoplasm of breast: Secondary | ICD-10-CM | POA: Diagnosis not present

## 2018-11-13 DIAGNOSIS — Z1272 Encounter for screening for malignant neoplasm of vagina: Secondary | ICD-10-CM | POA: Diagnosis not present

## 2018-11-13 DIAGNOSIS — Z01419 Encounter for gynecological examination (general) (routine) without abnormal findings: Secondary | ICD-10-CM | POA: Diagnosis not present

## 2018-11-13 DIAGNOSIS — Z6841 Body Mass Index (BMI) 40.0 and over, adult: Secondary | ICD-10-CM | POA: Diagnosis not present

## 2018-11-27 DIAGNOSIS — M75102 Unspecified rotator cuff tear or rupture of left shoulder, not specified as traumatic: Secondary | ICD-10-CM | POA: Diagnosis not present

## 2019-01-08 DIAGNOSIS — D225 Melanocytic nevi of trunk: Secondary | ICD-10-CM | POA: Diagnosis not present

## 2019-01-08 DIAGNOSIS — L821 Other seborrheic keratosis: Secondary | ICD-10-CM | POA: Diagnosis not present

## 2019-01-08 DIAGNOSIS — D1801 Hemangioma of skin and subcutaneous tissue: Secondary | ICD-10-CM | POA: Diagnosis not present

## 2019-01-08 DIAGNOSIS — L820 Inflamed seborrheic keratosis: Secondary | ICD-10-CM | POA: Diagnosis not present

## 2019-01-08 DIAGNOSIS — L814 Other melanin hyperpigmentation: Secondary | ICD-10-CM | POA: Diagnosis not present

## 2019-01-13 DIAGNOSIS — Z20828 Contact with and (suspected) exposure to other viral communicable diseases: Secondary | ICD-10-CM | POA: Diagnosis not present

## 2019-01-16 ENCOUNTER — Other Ambulatory Visit: Payer: Self-pay | Admitting: *Deleted

## 2019-01-16 DIAGNOSIS — Z20828 Contact with and (suspected) exposure to other viral communicable diseases: Secondary | ICD-10-CM | POA: Diagnosis not present

## 2019-01-16 DIAGNOSIS — Z20822 Contact with and (suspected) exposure to covid-19: Secondary | ICD-10-CM

## 2019-01-17 LAB — NOVEL CORONAVIRUS, NAA: SARS-CoV-2, NAA: NOT DETECTED

## 2019-02-04 ENCOUNTER — Other Ambulatory Visit: Payer: Self-pay | Admitting: Cardiology

## 2019-02-04 ENCOUNTER — Encounter: Payer: Self-pay | Admitting: Cardiology

## 2019-02-04 ENCOUNTER — Other Ambulatory Visit: Payer: Self-pay

## 2019-02-04 ENCOUNTER — Ambulatory Visit: Payer: Federal, State, Local not specified - PPO | Admitting: Cardiology

## 2019-02-04 VITALS — BP 128/74 | HR 72 | Ht 60.0 in | Wt 237.4 lb

## 2019-02-04 DIAGNOSIS — I493 Ventricular premature depolarization: Secondary | ICD-10-CM | POA: Diagnosis not present

## 2019-02-04 MED ORDER — METOPROLOL SUCCINATE ER 100 MG PO TB24: ORAL_TABLET | ORAL | 3 refills | Status: DC

## 2019-02-04 MED ORDER — METOPROLOL SUCCINATE ER 25 MG PO TB24: 25.0000 mg | ORAL_TABLET | Freq: Every evening | ORAL | 3 refills | Status: DC

## 2019-02-04 NOTE — Patient Instructions (Signed)
Medication Instructions:  Your physician recommends that you continue on your current medications as directed. Please refer to the Current Medication list given to you today.  * If you need a refill on your cardiac medications before your next appointment, please call your pharmacy.   Labwork: None ordered  Testing/Procedures: None ordered  Follow-Up: At [facility], you and your health needs are our priority.  As part of our continuing mission to provide you with exceptional heart care, we have created designated Provider Care Teams.  These Care Teams include your primary Cardiologist (physician) and Advanced Practice Providers (APPs -  Physician Assistants and Nurse Practitioners) who all work together to provide you with the care you need, when you need it.  You will need a follow up appointment in 1 year.  Please call our office 2 months in advance to schedule this appointment.  You may see Dr Curt Bears or one of the following Advanced Practice Providers on your designated Care Team:    Chanetta Marshall, NP  Tommye Standard, PA-C  Oda Kilts, [place]   Thank you for choosing [facility]!!   Trinidad Curet, RN [phone]

## 2019-02-04 NOTE — Progress Notes (Signed)
Electrophysiology Office Note   Date:  02/04/2019   ID:  Alexis Knight, DOB 15-May-1960, MRN [id number]  PCP:  Josetta Huddle, MD  Cardiologist:  Johnsie Cancel Primary Electrophysiologist:  Marka Treloar Meredith Leeds, MD    No chief complaint on file.    History of Present Illness: Alexis Knight is a 58 y.o. female who is being seen today for the evaluation of PVCs at the request of [place]. Presenting today for electrophysiology evaluation.  She has a history of headaches, anxiety, and palpitations with PVCs.  The past 4 months she has been having issues with palpitations.  Palpitations were apparently related to high levels of stress.  Her symptoms occurred primarily while she was at work.  She was put on metoprolol which gave her symptomatic improvement, but she did continue to have palpitations daily while at work.  No chest pain, dizziness, dyspnea, lightheadedness, syncope, or near syncope.  Today, denies symptoms of palpitations, chest pain, shortness of breath, orthopnea, PND, lower extremity edema, claudication, dizziness, presyncope, syncope, bleeding, or neurologic sequela. The patient is tolerating medications without difficulties.  Overall she is doing well.  She has no chest pain or shortness of breath.  She feels that she is no longer having PVCs.  Since working at home, she is felt much improved with less stress.   Past Medical History:  Diagnosis Date  . Anxiety   . Headache(784.0)    migraines hx  . Hypothyroidism   . Nasal sinus congestion   . PONV (postoperative nausea and vomiting)    Past Surgical History:  Procedure Laterality Date  . ABDOMINAL HYSTERECTOMY    . ANTERIOR CERVICAL DECOMP/DISCECTOMY FUSION N/A 02/02/2017   Procedure: ANTERIOR CERVICAL DECOMPRESSION/DISCECTOMY FUSION C5-6, C6-7;  Surgeon: Melina Schools, MD;  Location: [facility];  Service: Orthopedics;  Laterality: N/A;  . BLADDER SUSPENSION  01/17/2012   Procedure: TRANSVAGINAL TAPE (TVT) PROCEDURE;   Surgeon: Olga Millers, MD;  Location: [facility];  Service: Gynecology;  Laterality: N/A;  with cystoscopy and excision of hemmorhoid  . CESAREAN SECTION    . SHOULDER ARTHROSCOPY Left   . THYROIDECTOMY, PARTIAL    . TONSILLECTOMY       Current Outpatient Medications  Medication Sig Dispense Refill  . albuterol (PROVENTIL HFA;VENTOLIN HFA) 108 (90 Base) MCG/ACT inhaler Inhale into the lungs as directed.    [name] Kitchen aspirin 81 MG chewable tablet Chew 1 tablet by mouth daily.    . cetirizine (ZYRTEC) 10 MG tablet Take 10 mg by mouth daily.    . Fluticasone-Salmeterol (ADVAIR HFA IN) Inhale into the lungs as directed.    [name] Kitchen glycopyrrolate (ROBINUL) 2 MG tablet Take 1 tablet by mouth daily.    [name] Kitchen levothyroxine (SYNTHROID, LEVOTHROID) 112 MCG tablet Take 112 mcg by mouth daily before breakfast.     . metoprolol succinate (TOPROL-XL) 100 MG 24 hr tablet TAKE ONE TABLET BY MOUTH EVERY MORNING WITH OR IMMEDIATELY FOLLOWING A MEAL 90 tablet 0  . metoprolol succinate (TOPROL-XL) 25 MG 24 hr tablet TAKE ONE TABLET BY MOUTH EVERY EVENING 90 tablet 0  . sertraline (ZOLOFT) 50 MG tablet Take 50 mg by mouth daily.     No current facility-administered medications for this visit.     Allergies:   Penicillamine, Penicillins, Metronidazole, and Sulfa antibiotics   Social History:  The patient  reports that she has never smoked. She has never used smokeless tobacco. She reports that she does not drink alcohol or use drugs.   Family History:  The  patient's family history includes Atrial fibrillation in her father; Breast cancer in her paternal grandmother; Cervical cancer in her maternal grandmother; Dementia in her mother; Emphysema in her maternal grandfather; Heart attack in her father and maternal grandmother; Hyperlipidemia in her brother; Hypertension in her brother; Prostate cancer in her maternal grandfather.    ROS:  Please see the history of present illness.   Otherwise, review of systems is positive for  none.   All other systems are reviewed and negative.   PHYSICAL EXAM: VS:  BP 128/74   Pulse 72   Ht 5' (1.524 m)   Wt 237 lb 6.4 oz (107.7 kg)   SpO2 98%   BMI 46.36 kg/m  , BMI Body mass index is 46.36 kg/m. GEN: Well nourished, well developed, in no acute distress  HEENT: normal  Neck: no JVD, carotid bruits, or masses Cardiac: RRR; no murmurs, rubs, or gallops,no edema  Respiratory:  clear to auscultation bilaterally, normal work of breathing GI: soft, nontender, nondistended, + BS MS: no deformity or atrophy  Skin: warm and dry Neuro:  Strength and sensation are intact Psych: euthymic mood, full affect  EKG:  EKG is ordered today. Personal review of the ekg ordered shows sinus rhythm, rate 72  Recent Labs: No results found for requested labs within last 8760 hours.    Lipid Panel  No results found for: CHOL, TRIG, HDL, CHOLHDL, VLDL, LDLCALC, LDLDIRECT   Wt Readings from Last 3 Encounters:  02/04/19 237 lb 6.4 oz (107.7 kg)  05/04/18 231 lb (104.8 kg)  12/11/17 233 lb (105.7 kg)      Other studies Reviewed: Additional studies/ records that were reviewed today include: TTE 05/12/17  Review of the above records today demonstrates:  - Left ventricle: The cavity size was normal. Systolic function was   at the lower limits of normal. The estimated ejection fraction   was = 50%. Wall motion was normal; there were no regional wall   motion abnormalities. Features are consistent with a pseudonormal   left ventricular filling pattern, with concomitant abnormal   relaxation and increased filling pressure (grade 2 diastolic   dysfunction). - Mitral valve: There was mild regurgitation. - Left atrium: The atrium was mildly dilated.  Holter 08/22/17 - personally reviewed NSR Average HR 93 bpm PVCls frequent 11% of beats Occasional bigeminy No significant NSVT  SPECT 08/30/17  Nuclear stress EF: 50%. The left ventricular ejection fraction is mildly decreased (45-54%).   This is a low risk study.  The study is normal. No evidence of ischemia or infarction   ASSESSMENT AND PLAN:  1.  PVCs: Currently on metoprolol.  Felt much better since working at home with quite a bit less stress.  She is not having ECG today.  No changes.  2.  Grade 2 diastolic dysfunction: Mild volume overload  3.  Morbid obesity: Diet and exercise encouraged  Current medicines are reviewed at length with the patient today.   The patient does not have concerns regarding her medicines.  The following changes were made today: None  Labs/ tests ordered today include:  Orders Placed This Encounter  Procedures  . EKG 12-Lead     Disposition:   FU with Osha Errico 12 months  Signed, Mazie Fencl Meredith Leeds, MD  02/04/2019 9:55 AM     [facility] [address] [place] [address] [phone] (office) [phone] (fax)

## 2019-02-04 NOTE — Addendum Note (Signed)
Addended by: Stanton Kidney on: 02/04/2019 10:17 AM   Modules accepted: Orders

## 2019-02-13 DIAGNOSIS — Z23 Encounter for immunization: Secondary | ICD-10-CM | POA: Diagnosis not present

## 2019-05-14 DIAGNOSIS — J029 Acute pharyngitis, unspecified: Secondary | ICD-10-CM | POA: Diagnosis not present

## 2019-06-14 DIAGNOSIS — E890 Postprocedural hypothyroidism: Secondary | ICD-10-CM | POA: Diagnosis not present

## 2019-06-14 DIAGNOSIS — Z808 Family history of malignant neoplasm of other organs or systems: Secondary | ICD-10-CM | POA: Diagnosis not present

## 2019-07-23 DIAGNOSIS — M25512 Pain in left shoulder: Secondary | ICD-10-CM | POA: Diagnosis not present

## 2019-09-27 ENCOUNTER — Telehealth: Payer: Self-pay | Admitting: Cardiology

## 2019-09-27 NOTE — Telephone Encounter (Signed)
New Message   STAT if HR is under 50 or over 120 (normal HR is 60-100 beats per minute)  1) What is your heart rate? Pt did not have readings available, she says when the nurse calls she will have them   2) Do you have a log of your heart rate readings (document readings)? She uses an app  3) Do you have any other symptoms? Pt says she has been getting a rubber band feeling in her chest. She says she has had no other symptoms and will have heart rate readings available when the nurse calls her   Please call

## 2019-09-27 NOTE — Telephone Encounter (Signed)
She reports a rubber band feeling in her chest, a tightness.  Been ongoing for several days, feels it constantly. HRs avg 80s. She does report recent stress/anxiety.  She is helping taking care of her parents and work is expecting a lot of her -- this is causing a lot of stress/anxiety. Stress/anxiety issues seems to stem around the time the feeling in her chest occurs. Pt scheduled for further evaluation in office next week.  May need to think about holter monitoring. Advised to go to ED if chest tightness worsens/changes in nature Patient verbalized understanding and agreeable to plan.

## 2019-10-03 ENCOUNTER — Ambulatory Visit: Payer: Federal, State, Local not specified - PPO | Admitting: Student

## 2019-10-03 ENCOUNTER — Encounter: Payer: Self-pay | Admitting: Student

## 2019-10-03 ENCOUNTER — Other Ambulatory Visit: Payer: Self-pay

## 2019-10-03 VITALS — BP 118/70 | HR 75 | Ht 60.0 in | Wt 233.0 lb

## 2019-10-03 DIAGNOSIS — R000 Tachycardia, unspecified: Secondary | ICD-10-CM

## 2019-10-03 DIAGNOSIS — R0789 Other chest pain: Secondary | ICD-10-CM

## 2019-10-03 DIAGNOSIS — I493 Ventricular premature depolarization: Secondary | ICD-10-CM | POA: Diagnosis not present

## 2019-10-03 DIAGNOSIS — R002 Palpitations: Secondary | ICD-10-CM | POA: Diagnosis not present

## 2019-10-03 DIAGNOSIS — I5032 Chronic diastolic (congestive) heart failure: Secondary | ICD-10-CM

## 2019-10-03 MED ORDER — METOPROLOL SUCCINATE ER 50 MG PO TB24
ORAL_TABLET | ORAL | 3 refills | Status: DC
Start: 2019-10-03 — End: 2020-01-16

## 2019-10-03 NOTE — Progress Notes (Signed)
PCP:  Josetta Huddle, MD Primary Cardiologist: No primary care provider on file. Electrophysiologist: Will Meredith Leeds, MD   Alexis Knight is a 59 y.o. female seen today for Will Meredith Leeds, MD for acute visit due to chest discomfort/palpitations.  Since last being seen in our clinic the patient reports doing reports an approximately 1-2 week history of chest tightness she describes as a "rubber band pulling outward". It is highly related to stress per patient, and most often occurs at work or when she is caring for her mother who has very severe dementia. She does NOT have symptoms when she is chasing her grandchild around for up to 4 hours. On days that she has it, she will have it for hours, and at times most of the day. She has 1 cup of coffee daily. She rates the pain 8/10 and motions that it goes from the center of her chest outward bilaterally.    Past Medical History:  Diagnosis Date  . Anxiety   . Headache(784.0)    migraines hx  . Hypothyroidism   . Nasal sinus congestion   . PONV (postoperative nausea and vomiting)    Past Surgical History:  Procedure Laterality Date  . ABDOMINAL HYSTERECTOMY    . ANTERIOR CERVICAL DECOMP/DISCECTOMY FUSION N/A 02/02/2017   Procedure: ANTERIOR CERVICAL DECOMPRESSION/DISCECTOMY FUSION C5-6, C6-7;  Surgeon: Melina Schools, MD;  Location: [place];  Service: Orthopedics;  Laterality: N/A;  . BLADDER SUSPENSION  01/17/2012   Procedure: TRANSVAGINAL TAPE (TVT) PROCEDURE;  Surgeon: Olga Millers, MD;  Location: [facility];  Service: Gynecology;  Laterality: N/A;  with cystoscopy and excision of hemmorhoid  . CESAREAN SECTION    . SHOULDER ARTHROSCOPY Left   . THYROIDECTOMY, PARTIAL    . TONSILLECTOMY      Current Outpatient Medications  Medication Sig Dispense Refill  . albuterol (PROVENTIL HFA;VENTOLIN HFA) 108 (90 Base) MCG/ACT inhaler Inhale into the lungs as needed.     [name] Kitchen aspirin 81 MG chewable tablet Chew 1 tablet by mouth daily.    .  cetirizine (ZYRTEC) 10 MG tablet Take 10 mg by mouth as needed.     . fluticasone (CUTIVATE) 0.05 % cream as needed.    [name] Kitchen glycopyrrolate (ROBINUL) 2 MG tablet Take 1 tablet by mouth daily.    [name] Kitchen levothyroxine (SYNTHROID, LEVOTHROID) 112 MCG tablet Take 112 mcg by mouth daily before breakfast.     . metoprolol succinate (TOPROL-XL) 100 MG 24 hr tablet TAKE ONE TABLET BY MOUTH EVERY MORNING WITH OR IMMEDIATELY FOLLOWING A MEAL 90 tablet 3  . metoprolol succinate (TOPROL-XL) 25 MG 24 hr tablet Take 1 tablet (25 mg total) by mouth every evening. 90 tablet 3  . sertraline (ZOLOFT) 100 MG tablet Take 100 mg by mouth 2 (two) times daily.     No current facility-administered medications for this visit.    Allergies  Allergen Reactions  . Penicillamine Anaphylaxis  . Penicillins Shortness Of Breath and Other (See Comments)    Pt CAN take amoxicillin and Keflex with no problems  . Metronidazole Hives  . Sulfa Antibiotics Rash    Social History   Socioeconomic History  . Marital status: Married    Spouse name: Not on file  . Number of children: Not on file  . Years of education: Not on file  . Highest education level: Not on file  Occupational History  . Not on file  Tobacco Use  . Smoking status: Never Smoker  . Smokeless tobacco: Never Used  Vaping Use  . Vaping Use: Never used  Substance and Sexual Activity  . Alcohol use: No  . Drug use: No  . Sexual activity: Not on file  Other Topics Concern  . Not on file  Social History Narrative  . Not on file   Social Determinants of Health   Financial Resource Strain:   . Difficulty of Paying Living Expenses:   Food Insecurity:   . Worried About Charity fundraiser in the Last Year:   . Arboriculturist in the Last Year:   Transportation Needs:   . Film/video editor (Medical):   [name] Kitchen Lack of Transportation (Non-Medical):   Physical Activity:   . Days of Exercise per Week:   . Minutes of Exercise per Session:   Stress:   .  Feeling of Stress :   Social Connections:   . Frequency of Communication with Friends and Family:   . Frequency of Social Gatherings with Friends and Family:   . Attends Religious Services:   . Active Member of Clubs or Organizations:   . Attends Archivist Meetings:   [name] Kitchen Marital Status:   Intimate Partner Violence:   . Fear of Current or Ex-Partner:   . Emotionally Abused:   [name] Kitchen Physically Abused:   . Sexually Abused:      Review of Systems: All other systems reviewed and are otherwise negative except as noted above.  Physical Exam: Vitals:   10/03/19 1212  BP: 118/70  Pulse: 75  SpO2: 97%  Weight: 233 lb (105.7 kg)  Height: 5' (1.524 m)    GEN- The patient is well appearing, alert and oriented x 3 today.   HEENT: normocephalic, atraumatic; sclera clear, conjunctiva pink; hearing intact; oropharynx clear; neck supple, no JVP Lymph- no cervical lymphadenopathy Lungs- Clear to ausculation bilaterally, normal work of breathing.  No wheezes, rales, rhonchi Heart- Regular rate and rhythm, no murmurs, rubs or gallops, PMI not laterally displaced GI- soft, non-tender, non-distended, bowel sounds present, no hepatosplenomegaly Extremities- no clubbing, cyanosis, or edema; DP/PT/radial pulses 2+ bilaterally MS- no significant deformity or atrophy Skin- warm and dry, no rash or lesion Psych- euthymic mood, full affect Neuro- strength and sensation are intact  EKG is ordered. Personal review of EKG from today shows NSR at 75 with normal intervals  Additional studies reviewed include: Previous EP office notes, Previous Echo, Previous myoview  Assessment and Plan:  1. Atypical chest discomfort/palpitations Of note, normal myoview 2019. But if echo down, would repeat.  Have occurred most days in the past 1-2 weeks, usually in setting of stress.  No exertional component. Can occur at rest and not worsened by exertion.  Will increase toprol to help with symptoms and  palpitations.  Will repeat Echo for completeness.  Will place 14 day Zio AT to evaluate for arryhtmias as cause in setting of associated palpitations.  Labwork today.  Stress is a driving factor in her symptoms.   2. PVCs Increase toprol to 100 mg q am and 50 mg q pm.  No PVCs on EKG today  3. Grade 2 Diastolic dysfunction Not currently on diuretic Volume status  4. Morbid obesity Encouraged lifestyle modification Body mass index is 45.5 kg/m.   Shirley Friar, PA-C  10/03/19 12:36 PM

## 2019-10-03 NOTE — Patient Instructions (Addendum)
Medication Instructions:  Your physician has recommended you make the following change in your medication:  -- INCREASE Metoprolol - Take 100 mg in the morning and 50 mg in the evening  *If you need a refill on your cardiac medications before your next appointment, please call your pharmacy*  Lab Work: Your physician has recommended that you have lab work today: CMET, TSH, Magnesium Level, and CBC  If you have labs (blood work) drawn today and your tests are completely normal, you will receive your results only by: [name] Kitchen MyChart Message (if you have MyChart) OR . A paper copy in the mail If you have any lab test that is abnormal or we need to change your treatment, we will call you to review the results.  Testing/Procedures: Your physician has requested that you have an echocardiogram. Echocardiography is a painless test that uses sound waves to create images of your heart. It provides your doctor with information about the size and shape of your heart and how well your heart's chambers and valves are working. This procedure takes approximately one hour. There are no restrictions for this procedure.   Follow-Up: At [facility], you and your health needs are our priority.  As part of our continuing mission to provide you with exceptional heart care, we have created designated Provider Care Teams.  These Care Teams include your primary Cardiologist (physician) and Advanced Practice Providers (APPs -  Physician Assistants and Nurse Practitioners) who all work together to provide you with the care you need, when you need it.  We recommend signing up for the patient portal called "MyChart".  Sign up information is provided on this After Visit Summary.  MyChart is used to connect with patients for Virtual Visits (Telemedicine).  Patients are able to view lab/test results, encounter notes, upcoming appointments, etc.  Non-urgent messages can be sent to your provider as well.   To learn more about what  you can do with MyChart, go to [email or web address].    Your next appointment:   Your physician wants you to follow-up in 2 MONTHS  with Dr. Curt Bears.   The format for your next appointment:   In Person with Allegra Lai, MD

## 2019-10-04 LAB — CBC WITH DIFFERENTIAL/PLATELET
Basophils Absolute: 0 10*3/uL (ref 0.0–0.2)
Basos: 1 %
EOS (ABSOLUTE): 0.3 10*3/uL (ref 0.0–0.4)
Eos: 4 %
Hematocrit: 40.5 % (ref 34.0–46.6)
Hemoglobin: 12.8 g/dL (ref 11.1–15.9)
Immature Grans (Abs): 0 10*3/uL (ref 0.0–0.1)
Immature Granulocytes: 0 %
Lymphocytes Absolute: 1.9 10*3/uL (ref 0.7–3.1)
Lymphs: 27 %
MCH: 26.4 pg — ABNORMAL LOW (ref 26.6–33.0)
MCHC: 31.6 g/dL (ref 31.5–35.7)
MCV: 84 fL (ref 79–97)
Monocytes Absolute: 0.5 10*3/uL (ref 0.1–0.9)
Monocytes: 7 %
Neutrophils Absolute: 4.3 10*3/uL (ref 1.4–7.0)
Neutrophils: 61 %
Platelets: 329 10*3/uL (ref 150–450)
RBC: 4.85 x10E6/uL (ref 3.77–5.28)
RDW: 14.3 % (ref 11.7–15.4)
WBC: 6.9 10*3/uL (ref 3.4–10.8)

## 2019-10-04 LAB — COMPREHENSIVE METABOLIC PANEL
ALT: 26 IU/L (ref 0–32)
AST: 24 IU/L (ref 0–40)
Albumin/Globulin Ratio: 1.9 (ref 1.2–2.2)
Albumin: 4.3 g/dL (ref 3.8–4.9)
Alkaline Phosphatase: 112 IU/L (ref 48–121)
BUN/Creatinine Ratio: 18 (ref 9–23)
BUN: 12 mg/dL (ref 6–24)
Bilirubin Total: 0.2 mg/dL (ref 0.0–1.2)
CO2: 25 mmol/L (ref 20–29)
Calcium: 9.5 mg/dL (ref 8.7–10.2)
Chloride: 102 mmol/L (ref 96–106)
Creatinine, Ser: 0.67 mg/dL (ref 0.57–1.00)
GFR calc Af Amer: 114 mL/min/{1.73_m2} (ref >59)
GFR calc non Af Amer: 99 mL/min/{1.73_m2} (ref >59)
Globulin, Total: 2.3 g/dL (ref 1.5–4.5)
Glucose: 101 mg/dL — ABNORMAL HIGH (ref 65–99)
Potassium: 4.6 mmol/L (ref 3.5–5.2)
Sodium: 141 mmol/L (ref 134–144)
Total Protein: 6.6 g/dL (ref 6.0–8.5)

## 2019-10-04 LAB — TSH: TSH: 3.41 u[IU]/mL (ref 0.450–4.500)

## 2019-10-04 LAB — MAGNESIUM: Magnesium: 2 mg/dL (ref 1.6–2.3)

## 2019-10-09 ENCOUNTER — Ambulatory Visit (INDEPENDENT_AMBULATORY_CARE_PROVIDER_SITE_OTHER): Payer: Federal, State, Local not specified - PPO

## 2019-10-09 DIAGNOSIS — R002 Palpitations: Secondary | ICD-10-CM

## 2019-10-28 ENCOUNTER — Ambulatory Visit (HOSPITAL_COMMUNITY): Payer: Federal, State, Local not specified - PPO | Attending: Internal Medicine

## 2019-10-28 ENCOUNTER — Other Ambulatory Visit: Payer: Self-pay

## 2019-10-28 DIAGNOSIS — R0789 Other chest pain: Secondary | ICD-10-CM | POA: Diagnosis not present

## 2019-10-28 DIAGNOSIS — R002 Palpitations: Secondary | ICD-10-CM | POA: Insufficient documentation

## 2019-10-28 LAB — ECHOCARDIOGRAM COMPLETE
Area-P 1/2: 3.68 cm2
S' Lateral: 3.5 cm

## 2019-11-11 DIAGNOSIS — R079 Chest pain, unspecified: Secondary | ICD-10-CM

## 2019-11-21 ENCOUNTER — Ambulatory Visit (INDEPENDENT_AMBULATORY_CARE_PROVIDER_SITE_OTHER)
Admission: RE | Admit: 2019-11-21 | Discharge: 2019-11-21 | Disposition: A | Discharge status: Home / Self Care | Payer: Self-pay | Source: Ambulatory Visit | Attending: Student | Admitting: Student

## 2019-11-21 ENCOUNTER — Other Ambulatory Visit: Payer: Self-pay

## 2019-11-21 DIAGNOSIS — Z1231 Encounter for screening mammogram for malignant neoplasm of breast: Secondary | ICD-10-CM | POA: Diagnosis not present

## 2019-11-21 DIAGNOSIS — R079 Chest pain, unspecified: Secondary | ICD-10-CM

## 2019-11-25 ENCOUNTER — Telehealth: Payer: Self-pay

## 2019-11-25 NOTE — Telephone Encounter (Signed)
Pt is aware and agreeable to CT results. Pt had a concern about mention of lung nodule. I confirmed with Alexis Knight that the nodule is nothing of concern to the patient. It is mentioned in the report as simply a finding. She was relieved.

## 2019-11-25 NOTE — Telephone Encounter (Signed)
-----   Message from Shirley Friar, PA-C sent at 11/22/2019 12:17 PM EDT ----- Please let her know her coronary CT looks great. Her Coronary Ca score is 0 (best).

## 2020-01-16 ENCOUNTER — Other Ambulatory Visit: Payer: Self-pay

## 2020-01-16 ENCOUNTER — Encounter: Payer: Self-pay | Admitting: Cardiology

## 2020-01-16 ENCOUNTER — Ambulatory Visit: Payer: Federal, State, Local not specified - PPO | Admitting: Cardiology

## 2020-01-16 VITALS — BP 120/86 | HR 75 | Ht 60.0 in | Wt 232.0 lb

## 2020-01-16 DIAGNOSIS — I493 Ventricular premature depolarization: Secondary | ICD-10-CM | POA: Diagnosis not present

## 2020-01-16 MED ORDER — METOPROLOL SUCCINATE ER 100 MG PO TB24: 100.0000 mg | ORAL_TABLET | Freq: Two times a day (BID) | ORAL | 2 refills | Status: DC

## 2020-01-16 NOTE — Addendum Note (Signed)
Addended by: Stanton Kidney on: 01/16/2020 03:01 PM   Modules accepted: Orders

## 2020-01-16 NOTE — Progress Notes (Signed)
Electrophysiology Office Note   Date:  01/16/2020   ID:  Alexis Knight, DOB November 01, 1960, MRN [id number]  PCP:  Josetta Huddle, MD  Cardiologist:  Johnsie Cancel Primary Electrophysiologist:  Lavita Pontius Meredith Leeds, MD    No chief complaint on file.    History of Present Illness: Alexis Knight is a 59 y.o. female who is being seen today for the evaluation of PVCs at the request of [place]. Presenting today for electrophysiology evaluation.    She has a history significant for headaches, anxiety, palpitations.  She was put on metoprolol, with continued symptoms of palpitations, though they were improved.  Today, denies symptoms of palpitations, chest pain, shortness of breath, orthopnea, PND, lower extremity edema, claudication, dizziness, presyncope, syncope, bleeding, or neurologic sequela. The patient is tolerating medications without difficulties.  She continues to have palpitations but they are improved.  She does continue to have squeezing in her chest.  This occurs at all times and is not exacerbated or alleviated by any factor.  She had coronary calcium scoring which was 0.  She feels that this may be due to stress, but is unsure.  Past Medical History:  Diagnosis Date  . Anxiety   . Headache(784.0)    migraines hx  . Hypothyroidism   . Nasal sinus congestion   . PONV (postoperative nausea and vomiting)    Past Surgical History:  Procedure Laterality Date  . ABDOMINAL HYSTERECTOMY    . ANTERIOR CERVICAL DECOMP/DISCECTOMY FUSION N/A 02/02/2017   Procedure: ANTERIOR CERVICAL DECOMPRESSION/DISCECTOMY FUSION C5-6, C6-7;  Surgeon: Melina Schools, MD;  Location: [place];  Service: Orthopedics;  Laterality: N/A;  . BLADDER SUSPENSION  01/17/2012   Procedure: TRANSVAGINAL TAPE (TVT) PROCEDURE;  Surgeon: Olga Millers, MD;  Location: [facility];  Service: Gynecology;  Laterality: N/A;  with cystoscopy and excision of hemmorhoid  . CESAREAN SECTION    . SHOULDER ARTHROSCOPY Left   .  THYROIDECTOMY, PARTIAL    . TONSILLECTOMY       Current Outpatient Medications  Medication Sig Dispense Refill  . albuterol (PROVENTIL HFA;VENTOLIN HFA) 108 (90 Base) MCG/ACT inhaler Inhale into the lungs as needed.     [name] Kitchen aspirin 81 MG chewable tablet Chew 1 tablet by mouth daily.    . cetirizine (ZYRTEC) 10 MG tablet Take 10 mg by mouth as needed.     . fluticasone (CUTIVATE) 0.05 % cream as needed.    [name] Kitchen glycopyrrolate (ROBINUL) 2 MG tablet Take 1 tablet by mouth daily.    [name] Kitchen levothyroxine (SYNTHROID, LEVOTHROID) 112 MCG tablet Take 112 mcg by mouth daily before breakfast.     . metoprolol succinate (TOPROL-XL) 100 MG 24 hr tablet TAKE ONE TABLET BY MOUTH EVERY MORNING WITH OR IMMEDIATELY FOLLOWING A MEAL 90 tablet 3  . metoprolol succinate (TOPROL-XL) 50 MG 24 hr tablet Take 1 tablet (50 mg) by mouth daily in the evening. 90 tablet 3  . sertraline (ZOLOFT) 100 MG tablet Take 100 mg by mouth 2 (two) times daily.     No current facility-administered medications for this visit.    Allergies:   Penicillamine, Penicillins, Metronidazole, and Sulfa antibiotics   Social History:  The patient  reports that she has never smoked. She has never used smokeless tobacco. She reports that she does not drink alcohol and does not use drugs.   Family History:  The patient's family history includes Atrial fibrillation in her father; Breast cancer in her paternal grandmother; Cervical cancer in her maternal grandmother; Dementia in her mother;  Emphysema in her maternal grandfather; Heart attack in her father and maternal grandmother; Hyperlipidemia in her brother; Hypertension in her brother; Prostate cancer in her maternal grandfather.   ROS:  Please see the history of present illness.   Otherwise, review of systems is positive for none.   All other systems are reviewed and negative.   PHYSICAL EXAM: VS:  BP 120/86   Pulse 75   Ht 5' (1.524 m)   Wt 232 lb (105.2 kg)   SpO2 97%   BMI 45.31 kg/m  ,  BMI Body mass index is 45.31 kg/m. GEN: Well nourished, well developed, in no acute distress  HEENT: normal  Neck: no JVD, carotid bruits, or masses Cardiac: RRR; no murmurs, rubs, or gallops,no edema  Respiratory:  clear to auscultation bilaterally, normal work of breathing GI: soft, nontender, nondistended, + BS MS: no deformity or atrophy  Skin: warm and dry Neuro:  Strength and sensation are intact Psych: euthymic mood, full affect  EKG:  EKG is ordered today. Personal review of the ekg ordered shows sinus rhythm, rate 75  Recent Labs: 10/03/2019: ALT 26; BUN 12; Creatinine, Ser 0.67; Hemoglobin 12.8; Magnesium 2.0; Platelets 329; Potassium 4.6; Sodium 141; TSH 3.410    Lipid Panel  No results found for: CHOL, TRIG, HDL, CHOLHDL, VLDL, LDLCALC, LDLDIRECT   Wt Readings from Last 3 Encounters:  01/16/20 232 lb (105.2 kg)  10/03/19 233 lb (105.7 kg)  02/04/19 237 lb 6.4 oz (107.7 kg)      Other studies Reviewed: Additional studies/ records that were reviewed today include: TTE 05/12/17  Review of the above records today demonstrates:  - Left ventricle: The cavity size was normal. Systolic function was   at the lower limits of normal. The estimated ejection fraction   was = 50%. Wall motion was normal; there were no regional wall   motion abnormalities. Features are consistent with a pseudonormal   left ventricular filling pattern, with concomitant abnormal   relaxation and increased filling pressure (grade 2 diastolic   dysfunction). - Mitral valve: There was mild regurgitation. - Left atrium: The atrium was mildly dilated.  Cardiac monitor 11/18/2019 personally reviewed Predominant rhythm sinus rhythm No atrial fibrillation noted 3% PVCs Symptoms of palpitations, rapid heart rate, chest pain associated with sinus rhythm and sinus tachycardia No other arrhythmias noted    ASSESSMENT AND PLAN:  1.  PVCs: Currently on metoprolol.  She continues to have episodes of  palpitations and chest squeezing.  She fortunately had a coronary calcium score of 0.  Her palpitations are improved on her metoprolol.  We Alexis Knight increase to 100 mg of Toprol-XL twice daily.  She also has had squeezing in her chest.  Her increased dose of metoprolol may improve with this.  If it is not, it is possible that she has coronary vasospasm and we could try a nitroglycerin to see if this Alexis Knight help.  2.  Grade 2 diastolic dysfunction: No obvious volume overload  3.  Morbid obesity: Diet and exercise encouraged  Current medicines are reviewed at length with the patient today.   The patient does not have concerns regarding her medicines.  The following changes were made today: Increase Toprol-XL  Labs/ tests ordered today include:  Orders Placed This Encounter  Procedures  . EKG 12-Lead     Disposition:   FU with Alexis Knight 6 months  Signed, Alexis Knight Meredith Leeds, MD  01/16/2020 10:49 AM     [facility] [place]  [address] [address] [phone] (office) [phone] (fax)

## 2020-02-11 ENCOUNTER — Other Ambulatory Visit: Payer: Self-pay | Admitting: Cardiology

## 2020-02-13 DIAGNOSIS — H6980 Other specified disorders of Eustachian tube, unspecified ear: Secondary | ICD-10-CM | POA: Diagnosis not present

## 2020-02-13 DIAGNOSIS — H9313 Tinnitus, bilateral: Secondary | ICD-10-CM | POA: Diagnosis not present

## 2020-02-13 DIAGNOSIS — H903 Sensorineural hearing loss, bilateral: Secondary | ICD-10-CM | POA: Diagnosis not present

## 2020-02-20 DIAGNOSIS — E039 Hypothyroidism, unspecified: Secondary | ICD-10-CM | POA: Diagnosis not present

## 2020-02-20 DIAGNOSIS — R110 Nausea: Secondary | ICD-10-CM | POA: Diagnosis not present

## 2020-02-20 DIAGNOSIS — R42 Dizziness and giddiness: Secondary | ICD-10-CM | POA: Diagnosis not present

## 2020-04-08 DIAGNOSIS — K580 Irritable bowel syndrome with diarrhea: Secondary | ICD-10-CM | POA: Diagnosis not present

## 2020-04-08 DIAGNOSIS — Z86010 Personal history of colonic polyps: Secondary | ICD-10-CM | POA: Diagnosis not present

## 2020-05-25 DIAGNOSIS — D122 Benign neoplasm of ascending colon: Secondary | ICD-10-CM | POA: Diagnosis not present

## 2020-05-25 DIAGNOSIS — K5730 Diverticulosis of large intestine without perforation or abscess without bleeding: Secondary | ICD-10-CM | POA: Diagnosis not present

## 2020-05-25 DIAGNOSIS — Z86010 Personal history of colonic polyps: Secondary | ICD-10-CM | POA: Diagnosis not present

## 2020-05-29 ENCOUNTER — Ambulatory Visit
Admission: RE | Admit: 2020-05-29 | Discharge: 2020-05-29 | Disposition: A | Discharge status: Home / Self Care | Payer: Federal, State, Local not specified - PPO | Source: Ambulatory Visit | Attending: Family Medicine | Admitting: Family Medicine

## 2020-05-29 ENCOUNTER — Other Ambulatory Visit: Payer: Self-pay

## 2020-05-29 VITALS — BP 136/86 | HR 82 | Temp 98.4°F | Resp 16

## 2020-05-29 DIAGNOSIS — R300 Dysuria: Secondary | ICD-10-CM | POA: Diagnosis not present

## 2020-05-29 LAB — POCT URINALYSIS DIP (MANUAL ENTRY)
Bilirubin, UA: NEGATIVE
Glucose, UA: NEGATIVE mg/dL
Ketones, POC UA: NEGATIVE mg/dL
Nitrite, UA: NEGATIVE
Protein Ur, POC: 30 mg/dL — AB
Spec Grav, UA: 1.02 (ref 1.010–1.025)
Urobilinogen, UA: 0.2 E.U./dL
pH, UA: 5.5 (ref 5.0–8.0)

## 2020-05-29 MED ORDER — NITROFURANTOIN MONOHYD MACRO 100 MG PO CAPS: 100.0000 mg | ORAL_CAPSULE | Freq: Two times a day (BID) | ORAL | 0 refills | Status: DC

## 2020-05-29 NOTE — ED Provider Notes (Signed)
[facility]    CSN: [id number] Arrival date & time: 05/29/20  1358      History   Chief Complaint No chief complaint on file.   HPI Alexis Knight is a 60 y.o. female.   Patient is a 60 year old female presents today for urinary urgency, burning, pressure with urination.  This is been present since this morning waking up.  Reporting that she has not been drinking a lot of water and went off her cranberry pills.  No back pain, fever, chills, nausea or vomiting     Past Medical History:  Diagnosis Date  . Anxiety   . Headache(784.0)    migraines hx  . Hypothyroidism   . Nasal sinus congestion   . PONV (postoperative nausea and vomiting)     Patient Active Problem List   Diagnosis Date Noted  . Localized swelling, mass and lump, upper limb 04/09/2018  . Pain in joint of left elbow 02/06/2018  . Traumatic rupture of biceps tendon 12/30/2017  . History of thyroid disorder 12/11/2017  . Cervical spine pain 08/14/2017  . Encounter for postoperative care 08/14/2017  . S/P cervical spinal fusion 02/02/2017  . Lichen sclerosus of female genitalia 06/08/2016  . Seasonal allergic rhinitis 07/01/2015  . Lymphadenopathy of head and neck 06/02/2015  . Neck and shoulder pain 06/02/2015    Past Surgical History:  Procedure Laterality Date  . ABDOMINAL HYSTERECTOMY    . ANTERIOR CERVICAL DECOMP/DISCECTOMY FUSION N/A 02/02/2017   Procedure: ANTERIOR CERVICAL DECOMPRESSION/DISCECTOMY FUSION C5-6, C6-7;  Surgeon: Melina Schools, MD;  Location: [place];  Service: Orthopedics;  Laterality: N/A;  . BLADDER SUSPENSION  01/17/2012   Procedure: TRANSVAGINAL TAPE (TVT) PROCEDURE;  Surgeon: Olga Millers, MD;  Location: [facility];  Service: Gynecology;  Laterality: N/A;  with cystoscopy and excision of hemmorhoid  . CESAREAN SECTION    . SHOULDER ARTHROSCOPY Left   . THYROIDECTOMY, PARTIAL    . TONSILLECTOMY      OB History   No obstetric history on file.      Home  Medications    Prior to Admission medications   Medication Sig Start Date End Date Taking? Authorizing Provider  nitrofurantoin, macrocrystal-monohydrate, (MACROBID) 100 MG capsule Take 1 capsule (100 mg total) by mouth 2 (two) times daily. 05/29/20  Yes Bast, Traci A, NP  albuterol (PROVENTIL HFA;VENTOLIN HFA) 108 (90 Base) MCG/ACT inhaler Inhale into the lungs as needed.     [provider]  aspirin 81 MG chewable tablet Chew 1 tablet by mouth daily.    [provider]  cetirizine (ZYRTEC) 10 MG tablet Take 10 mg by mouth as needed.     [provider]  fluticasone (CUTIVATE) 0.05 % cream as needed.    [provider]  glycopyrrolate (ROBINUL) 2 MG tablet Take 1 tablet by mouth daily. 01/24/19   [provider]  levothyroxine (SYNTHROID, LEVOTHROID) 112 MCG tablet Take 112 mcg by mouth daily before breakfast.     [provider]  metoprolol succinate (TOPROL-XL) 100 MG 24 hr tablet Take 1 tablet (100 mg total) by mouth in the morning and at bedtime. Take with or immediately following a meal. 01/16/20   Camnitz, Ocie Doyne, MD  sertraline (ZOLOFT) 100 MG tablet Take 100 mg by mouth 2 (two) times daily. 08/11/19   [provider]    Family History Family History  Problem Relation Age of Onset  . Dementia Mother   . Heart attack Father   . Atrial fibrillation  Father   . Heart attack Maternal Grandmother   . Cervical cancer Maternal Grandmother   . Emphysema Maternal Grandfather   . Prostate cancer Maternal Grandfather   . Breast cancer Paternal Grandmother   . Hyperlipidemia Brother   . Hypertension Brother     Social History Social History   Tobacco Use  . Smoking status: Never Smoker  . Smokeless tobacco: Never Used  Vaping Use  . Vaping Use: Never used  Substance Use Topics  . Alcohol use: No  . Drug use: No     Allergies   Penicillamine, Penicillins, Metronidazole, and Sulfa antibiotics   Review of  Systems Review of Systems   Physical Exam Triage Vital Signs ED Triage Vitals [05/29/20 1415]  Enc Vitals Group     BP 136/86     Pulse Rate 82     Resp 16     Temp 98.4 F (36.9 C)     Temp Source Oral     SpO2 97 %     Weight      Height      Head Circumference      Peak Flow      Pain Score      Pain Loc      Pain Edu?      Excl. in [place]?    No data found.  Updated Vital Signs BP 136/86 (BP Location: Left Arm)   Pulse 82   Temp 98.4 F (36.9 C) (Oral)   Resp 16   SpO2 97%   Visual Acuity Right Eye Distance:   Left Eye Distance:   Bilateral Distance:    Right Eye Near:   Left Eye Near:    Bilateral Near:     Physical Exam Vitals and nursing note reviewed.  Constitutional:      General: She is not in acute distress.    Appearance: Normal appearance. She is not ill-appearing, toxic-appearing or diaphoretic.  HENT:     Head: Normocephalic.  Eyes:     Conjunctiva/sclera: Conjunctivae normal.  Pulmonary:     Effort: Pulmonary effort is normal.  Musculoskeletal:        General: Normal range of motion.     Cervical back: Normal range of motion.  Skin:    General: Skin is warm and dry.     Findings: No rash.  Neurological:     Mental Status: She is alert.  Psychiatric:        Mood and Affect: Mood normal.      UC Treatments / Results  Labs (all labs ordered are listed, but only abnormal results are displayed) Labs Reviewed  POCT URINALYSIS DIP (MANUAL ENTRY) - Abnormal; Notable for the following components:      Result Value   Clarity, UA cloudy (*)    Blood, UA large (*)    Protein Ur, POC =30 (*)    Leukocytes, UA Large (3+) (*)    All other components within normal limits  URINE CULTURE    EKG   Radiology No results found.  Procedures Procedures (including critical care time)  Medications Ordered in UC Medications - No data to display  Initial Impression / Assessment and Plan / UC Course  I have reviewed the triage vital signs  and the nursing notes.  Pertinent labs & imaging results that were available during my care of the patient were reviewed by me and considered in my medical decision making (see chart for details).     Dysuria Urine with  large leuks, large blood and cloudy Sending for culture.  We will go ahead and treat for urinary tract infection pending culture. Treating with Macrobid Recommended push fluids Follow up as needed for continued or worsening symptoms  Final Clinical Impressions(s) / UC Diagnoses   Final diagnoses:  Dysuria     Discharge Instructions     Treating you for urinary tract infection.  Take the medicine as prescribed Increase water intake Follow up as needed for continued or worsening symptoms     ED Prescriptions    Medication Sig Dispense Auth. Provider   nitrofurantoin, macrocrystal-monohydrate, (MACROBID) 100 MG capsule Take 1 capsule (100 mg total) by mouth 2 (two) times daily. 10 capsule Loura Halt A, NP     PDMP not reviewed this encounter.   Orvan July, NP 05/29/20 1423

## 2020-05-29 NOTE — ED Triage Notes (Signed)
Pt c/o dysuria sx. Denies feverr, n/v/d, abdominal pain.

## 2020-05-29 NOTE — Discharge Instructions (Addendum)
Treating you for urinary tract infection.  Take the medicine as prescribed Increase water intake Follow up as needed for continued or worsening symptoms

## 2020-05-31 LAB — URINE CULTURE

## 2020-06-02 DIAGNOSIS — F419 Anxiety disorder, unspecified: Secondary | ICD-10-CM | POA: Diagnosis not present

## 2020-07-28 DIAGNOSIS — F419 Anxiety disorder, unspecified: Secondary | ICD-10-CM | POA: Diagnosis not present

## 2020-07-31 ENCOUNTER — Ambulatory Visit: Payer: Self-pay

## 2020-07-31 DIAGNOSIS — S93601A Unspecified sprain of right foot, initial encounter: Secondary | ICD-10-CM | POA: Diagnosis not present

## 2020-08-10 DIAGNOSIS — S93601D Unspecified sprain of right foot, subsequent encounter: Secondary | ICD-10-CM | POA: Diagnosis not present

## 2020-09-16 ENCOUNTER — Encounter: Payer: Self-pay | Admitting: Cardiovascular Disease

## 2020-10-22 ENCOUNTER — Other Ambulatory Visit: Payer: Self-pay

## 2020-10-22 ENCOUNTER — Encounter: Payer: Self-pay | Admitting: Cardiology

## 2020-10-22 ENCOUNTER — Ambulatory Visit: Payer: Federal, State, Local not specified - PPO | Admitting: Cardiology

## 2020-10-22 VITALS — BP 124/68 | HR 85 | Ht 61.0 in | Wt 236.0 lb

## 2020-10-22 DIAGNOSIS — I493 Ventricular premature depolarization: Secondary | ICD-10-CM | POA: Diagnosis not present

## 2020-10-22 NOTE — Progress Notes (Signed)
Electrophysiology Office Note   Date:  10/22/2020   ID:  Alexis Knight, DOB [birth year], MRN [id number]  PCP:  Josetta Huddle, MD  Cardiologist:  Johnsie Cancel Primary Electrophysiologist:  Deidre Carino Meredith Leeds, MD    No chief complaint on file.    History of Present Illness: Alexis Knight is a 60 y.o. female who is being seen today for the evaluation of PVCs at the request of [place]. Presenting today for electrophysiology evaluation.    She has a history significant for headaches, anxiety, palpitations.  She was started on metoprolol with continued symptoms of palpitations though they were improved.  She does have a history of PVCs.  She was previously having a squeezing sensation in her chest.  She has a coronary calcium score of 0.  Today, denies symptoms of palpitations, chest pain, shortness of breath, orthopnea, PND, lower extremity edema, claudication, dizziness, presyncope, syncope, bleeding, or neurologic sequela. The patient is tolerating medications without difficulties.  Since last being seen she has done well.  She is felt well without PVCs or chest pain.  Unfortunately, she is not dealing with her mother who has dementia in her father who has severe coronary artery disease and aspiration pneumonia.  Her father and is in a facility currently and is undergoing hospice care.  This is quite a big stressor for her, but despite that, she continues to feel well.  Past Medical History:  Diagnosis Date   Anxiety    Headache(784.0)    migraines hx   Hypothyroidism    Nasal sinus congestion    PONV (postoperative nausea and vomiting)    Past Surgical History:  Procedure Laterality Date   ABDOMINAL HYSTERECTOMY     ANTERIOR CERVICAL DECOMP/DISCECTOMY FUSION N/A 02/02/2017   Procedure: ANTERIOR CERVICAL DECOMPRESSION/DISCECTOMY FUSION C5-6, C6-7;  Surgeon: Melina Schools, MD;  Location: [facility];  Service: Orthopedics;  Laterality: N/A;   BLADDER SUSPENSION  01/17/2012    Procedure: TRANSVAGINAL TAPE (TVT) PROCEDURE;  Surgeon: Olga Millers, MD;  Location: [facility];  Service: Gynecology;  Laterality: N/A;  with cystoscopy and excision of hemmorhoid   CESAREAN SECTION     SHOULDER ARTHROSCOPY Left    THYROIDECTOMY, PARTIAL     TONSILLECTOMY       Current Outpatient Medications  Medication Sig Dispense Refill   albuterol (PROVENTIL HFA;VENTOLIN HFA) 108 (90 Base) MCG/ACT inhaler Inhale into the lungs as needed.      cetirizine (ZYRTEC) 10 MG tablet Take 10 mg by mouth as needed.      fluticasone (CUTIVATE) 0.05 % cream as needed.     glycopyrrolate (ROBINUL) 2 MG tablet Take 1 tablet by mouth daily.     levothyroxine (SYNTHROID, LEVOTHROID) 112 MCG tablet Take 112 mcg by mouth daily before breakfast.      metoprolol succinate (TOPROL-XL) 100 MG 24 hr tablet Take 1 tablet (100 mg total) by mouth in the morning and at bedtime. Take with or immediately following a meal. 180 tablet 2   nitrofurantoin, macrocrystal-monohydrate, (MACROBID) 100 MG capsule Take 1 capsule (100 mg total) by mouth 2 (two) times daily. 10 capsule 0   sertraline (ZOLOFT) 100 MG tablet Take 100 mg by mouth 2 (two) times daily.     No current facility-administered medications for this visit.    Allergies:   Penicillamine, Penicillins, Metronidazole, and Sulfa antibiotics   Social History:  The patient  reports that she has never smoked. She has never used smokeless tobacco. She reports that she does not drink  alcohol and does not use drugs.   Family History:  The patient's family history includes Atrial fibrillation in her father; Breast cancer in her paternal grandmother; Cervical cancer in her maternal grandmother; Dementia in her mother; Emphysema in her maternal grandfather; Heart attack in her father and maternal grandmother; Hyperlipidemia in her brother; Hypertension in her brother; Prostate cancer in her maternal grandfather.   ROS:  Please see the history of present illness.    Otherwise, review of systems is positive for none.   All other systems are reviewed and negative.   PHYSICAL EXAM: VS:  BP 124/68   Pulse 85   Ht 5\' 1"  (1.549 m)   Wt 236 lb (107 kg)   BMI 44.59 kg/m  , BMI Body mass index is 44.59 kg/m. GEN: Well nourished, well developed, in no acute distress  HEENT: normal  Neck: no JVD, carotid bruits, or masses Cardiac: RRR; no murmurs, rubs, or gallops,no edema  Respiratory:  clear to auscultation bilaterally, normal work of breathing GI: soft, nontender, nondistended, + BS MS: no deformity or atrophy  Skin: warm and dry Neuro:  Strength and sensation are intact Psych: euthymic mood, full affect  EKG:  EKG is ordered today. Personal review of the ekg ordered shows sinus rhythm, rate 85   Recent Labs: No results found for requested labs within last 8760 hours.    Lipid Panel  No results found for: CHOL, TRIG, HDL, CHOLHDL, VLDL, LDLCALC, LDLDIRECT   Wt Readings from Last 3 Encounters:  10/22/20 236 lb (107 kg)  01/16/20 232 lb (105.2 kg)  10/03/19 233 lb (105.7 kg)      Other studies Reviewed: Additional studies/ records that were reviewed today include: TTE 05/12/17  Review of the above records today demonstrates:  - Left ventricle: The cavity size was normal. Systolic function was   at the lower limits of normal. The estimated ejection fraction   was = 50%. Wall motion was normal; there were no regional wall   motion abnormalities. Features are consistent with a pseudonormal   left ventricular filling pattern, with concomitant abnormal   relaxation and increased filling pressure (grade 2 diastolic   dysfunction). - Mitral valve: There was mild regurgitation. - Left atrium: The atrium was mildly dilated.   Cardiac monitor 11/18/2019 personally reviewed Predominant rhythm sinus rhythm No atrial fibrillation noted 3% PVCs Symptoms of palpitations, rapid heart rate, chest pain associated with sinus rhythm and sinus  tachycardia No other arrhythmias noted    ASSESSMENT AND PLAN:  1.  PVCs: Currently on metoprolol.  She is currently feeling well.  She has no chest pain or shortness of breath.  She does not note any PVCs.  We Alexis Knight continue with current management.  2.  Grade 2 diastolic dysfunction: No obvious volume overload  3.  Morbid obesity: Diet and exercise encouraged   Current medicines are reviewed at length with the patient today.   The patient does not have concerns regarding her medicines.  The following changes were made today: None  Labs/ tests ordered today include:  No orders of the defined types were placed in this encounter.    Disposition:   FU with Alexis Knight 12 months  Signed, Alexis Knight Meredith Leeds, MD  10/22/2020 8:42 AM     [facility] [address] [place] [address] [phone] (office) [phone] (fax)

## 2020-10-22 NOTE — Addendum Note (Signed)
Addended by: Claude Manges on: 10/22/2020 09:30 AM   Modules accepted: Orders

## 2020-11-06 ENCOUNTER — Ambulatory Visit
Admission: RE | Admit: 2020-11-06 | Discharge: 2020-11-06 | Disposition: A | Discharge status: Home / Self Care | Payer: Federal, State, Local not specified - PPO | Source: Ambulatory Visit | Attending: Emergency Medicine | Admitting: Emergency Medicine

## 2020-11-06 ENCOUNTER — Other Ambulatory Visit: Payer: Self-pay

## 2020-11-06 VITALS — BP 136/81 | HR 98 | Temp 99.5°F | Resp 16

## 2020-11-06 DIAGNOSIS — B349 Viral infection, unspecified: Secondary | ICD-10-CM | POA: Diagnosis not present

## 2020-11-06 DIAGNOSIS — R110 Nausea: Secondary | ICD-10-CM

## 2020-11-06 MED ORDER — ONDANSETRON 4 MG PO TBDP: 4.0000 mg | ORAL_TABLET | Freq: Three times a day (TID) | ORAL | 0 refills | Status: AC | PRN

## 2020-11-06 MED ORDER — AZELASTINE HCL 0.1 % NA SOLN: 2.0000 | Freq: Two times a day (BID) | NASAL | 0 refills | Status: DC | PRN

## 2020-11-06 NOTE — ED Provider Notes (Signed)
CHIEF COMPLAINT:   Chief Complaint  Patient presents with   Chills   Generalized Body Aches   Sore Throat   APPT 1330     SUBJECTIVE/HPI:   Sore Throat  A very pleasant 60 y.o.Female presents today with chills, generalized body aches and sore throat which started yesterday.  Patient states that last week her uncle passed away and 3 of her cousins have the same symptoms as she does.  She does not report any known COVID exposure from these individuals.  Patient states that she lives next door to her elderly parents and wants to make sure that she does not have any illness that she may be able to give them.  She does report feeling feverish, but does not report a measured temperature at home.  Patient also reports some nausea which has decreased her want for food or drink. Patient does not report any shortness of breath, chest pain, palpitations, visual changes, weakness, tingling, headache, vomiting, diarrhea.   has a past medical history of Anxiety, Headache(784.0), Hypothyroidism, Nasal sinus congestion, and PONV (postoperative nausea and vomiting).  ROS:  Review of Systems See Subjective/HPI Medications, Allergies and Problem List personally reviewed in Epic today OBJECTIVE:   Vitals:   11/06/20 1326  BP: 136/81  Pulse: 98  Resp: 16  Temp: 99.5 F (37.5 C)  SpO2: 95%    Physical Exam   General: Appears well-developed and well-nourished. No acute distress.  HEENT Head: Normocephalic and atraumatic.  + frontal, maxillary sinus tenderness noted to palpation.  Facial flushing noted to maxillary sinus region. Ears: Hearing grossly intact, no drainage or visible deformity.  Nose: No nasal deviation.  Mouth/Throat: No stridor or tracheal deviation.  Non erythematous posterior pharynx noted with clear drainage present.  No white patchy exudate noted. Eyes: Conjunctivae and EOM are normal. No eye drainage or scleral icterus bilaterally.  Neck: Normal range of motion, neck is  supple. No cervical, tonsillar or submandibular lymph nodes palpated.   Cardiovascular: Normal rate. Regular rhythm; no murmurs, gallops, or rubs.  Pulm/Chest: No respiratory distress. Breath sounds normal bilaterally without wheezes, rhonchi, or rales.  Neurological: Alert and oriented to person, place, and time.  Skin: Skin is warm and dry.  No rashes, lesions, abrasions or bruising noted to skin.   Psychiatric: Normal mood, affect, behavior, and thought content.   Vital signs and nursing note reviewed.   Patient stable and cooperative with examination. PROCEDURES:    LABS/X-RAYS/EKG/MEDS:   No results found for any visits on 11/06/20.  MEDICAL DECISION MAKING:   Patient presents with chills, generalized body aches and sore throat which started yesterday.  Patient states that last week her uncle passed away and 3 of her cousins have the same symptoms as she does.  She does not report any known COVID exposure from these individuals.  Patient states that she lives next door to her elderly parents and wants to make sure that she does not have any illness that she may be able to give them.  She does report feeling feverish, but does not report a measured temperature at home.  Patient also reports some nausea which has decreased her want for food or drink. Patient does not report any shortness of breath, chest pain, palpitations, visual changes, weakness, tingling, headache, vomiting, diarrhea.  Chart review completed.  COVID-19/influenza swab obtained in clinic today.  Given symptoms, likely viral illness.  Unable to completely rule out COVID-19 or influenza without having resulted her testing today.  No concern  for underlying bacterial etiology at this time.  Did Rx Zofran for her nausea and Astelin nasal spray to help with congestion.  Advised about home treatment and care to include Nettie pot and over-the-counter remedies such as Tylenol or ibuprofen along with Mucinex.  Return as  needed.  Patient verbalized understanding and agreed with treatment plan.  Patient stable upon discharge. ASSESSMENT/PLAN:  1. Viral illness - Covid-19, Flu A+B ([facility]); Standing - Covid-19, Flu A+B ([facility]) - azelastine (ASTELIN) 0.1 % nasal spray; Place 2 sprays into both nostrils 2 (two) times daily as needed for rhinitis. Use in each nostril as directed  Dispense: 30 mL; Refill: 0  2. Nausea without vomiting - ondansetron (ZOFRAN ODT) 4 MG disintegrating tablet; Take 1 tablet (4 mg total) by mouth every 8 (eight) hours as needed for up to 3 days for nausea or vomiting.  Dispense: 9 tablet; Refill: 0  Instructions about new medications and side effects provided.  Plan:   Discharge Instructions      We will call you with any positive results from your COVID-19/Influenza testing completed in clinic today.  If you do not receive a phone call from [place] within the next 2-3 days, check your MyChart for up-to-date health information related to testing completed in clinic today.   For most people this is a self-limiting process and can take anywhere from 7 - 10 days to start feeling better. A cough can last up to 3 weeks. Pay special attention to handwashing as this can help prevent the spread of the virus.   Always read the labels of cough and cold medications as they may contain some of the ingredients below.  Rest, push lots of fluids (especially water), and utilize supportive care for symptoms. You may take acetaminophen (Tylenol) every 4-6 hours and ibuprofen every 6-8 hours for muscle pain, joint pain, headaches (you may also alternate these medications). Mucinex (guaifenesin) may be taken over the counter for cough as needed can loosen phlegm. Please read the instructions and take as directed.  Sudafed (pseudophedrine) is sold behind the counter and can help reduce nasal pressure; avoid taking this if you have high blood pressure or feel jittery. Sudafed PE (phenylephrine) can be a  helpful, short-term, over-the-counter alternative to limit side effects or if you have high blood pressure.  Flonase nasal spray can help alleviate congestion and sinus pressure. Many patients choose Afrin as a nasal decongestant; do not use for more than 3 days for risk of rebound (increased symptoms after stopping medication).  Saline nasal sprays or rinses can also help nasal congestion (use bottled or sterile water). Warm tea with lemon and honey can sooth sore throat and cough, as can cough drops.   Return to clinic for high fever not improving with medications, chest pain, difficulty breathing, non-stop vomiting, or coughing blood. Follow-up with your primary care provider if symptoms do not improve as expected in the next 5-7 days.          Alexis Knight, [place] 11/06/20 1347

## 2020-11-06 NOTE — Discharge Instructions (Signed)
We will call you with any positive results from your COVID-19/Influenza testing completed in clinic today.  If you do not receive a phone call from [place] within the next 2-3 days, check your MyChart for up-to-date health information related to testing completed in clinic today.   For most people this is a self-limiting process and can take anywhere from 7 - 10 days to start feeling better. A cough can last up to 3 weeks. Pay special attention to handwashing as this can help prevent the spread of the virus.   Always read the labels of cough and cold medications as they may contain some of the ingredients below.  Rest, push lots of fluids (especially water), and utilize supportive care for symptoms. You may take acetaminophen (Tylenol) every 4-6 hours and ibuprofen every 6-8 hours for muscle pain, joint pain, headaches (you may also alternate these medications). Mucinex (guaifenesin) may be taken over the counter for cough as needed can loosen phlegm. Please read the instructions and take as directed.  Sudafed (pseudophedrine) is sold behind the counter and can help reduce nasal pressure; avoid taking this if you have high blood pressure or feel jittery. Sudafed PE (phenylephrine) can be a helpful, short-term, over-the-counter alternative to limit side effects or if you have high blood pressure.  Flonase nasal spray can help alleviate congestion and sinus pressure. Many patients choose Afrin as a nasal decongestant; do not use for more than 3 days for risk of rebound (increased symptoms after stopping medication).  Saline nasal sprays or rinses can also help nasal congestion (use bottled or sterile water). Warm tea with lemon and honey can sooth sore throat and cough, as can cough drops.   Return to clinic for high fever not improving with medications, chest pain, difficulty breathing, non-stop vomiting, or coughing blood. Follow-up with your primary care provider if symptoms do not improve as expected in  the next 5-7 days.

## 2020-11-06 NOTE — ED Triage Notes (Signed)
Patient c/o chills, generalized body aches, and sore throat x 1 day.   Patient denies fever at home. Patient denies SOB, ear pain, or headache.   Patient endorses "a scratchy throat and hurts to swallow".  Patient has taken Mucinex with no relief of symptoms.

## 2020-11-08 LAB — COVID-19, FLU A+B NAA
Influenza A, NAA: NOT DETECTED
Influenza B, NAA: NOT DETECTED
SARS-CoV-2, NAA: DETECTED — AB

## 2020-12-24 DIAGNOSIS — F419 Anxiety disorder, unspecified: Secondary | ICD-10-CM | POA: Diagnosis not present

## 2021-01-27 DIAGNOSIS — D170 Benign lipomatous neoplasm of skin and subcutaneous tissue of head, face and neck: Secondary | ICD-10-CM | POA: Diagnosis not present

## 2021-01-27 DIAGNOSIS — F419 Anxiety disorder, unspecified: Secondary | ICD-10-CM | POA: Diagnosis not present

## 2021-03-18 ENCOUNTER — Encounter: Payer: Self-pay | Admitting: Plastic Surgery

## 2021-03-18 ENCOUNTER — Ambulatory Visit: Payer: Federal, State, Local not specified - PPO | Admitting: Plastic Surgery

## 2021-03-18 ENCOUNTER — Other Ambulatory Visit: Payer: Self-pay

## 2021-03-18 VITALS — BP 145/83 | HR 77 | Ht 60.0 in | Wt 236.0 lb

## 2021-03-18 DIAGNOSIS — R221 Localized swelling, mass and lump, neck: Secondary | ICD-10-CM | POA: Diagnosis not present

## 2021-03-18 NOTE — Progress Notes (Signed)
Referring Provider Josetta Huddle, MD [address],  [place] [address]   CC:    Alexis Knight is an 60 y.o. female.  HPI: 60 year old female with a neck mass.  She has had a history of thyroidectomy.  She has had this fatty mass imaged in the past.  She notes the mass is gotten significantly larger.  She is interested in possibly having this removed.  Allergies  Allergen Reactions   Penicillamine Anaphylaxis   Penicillins Shortness Of Breath and Other (See Comments)    Pt CAN take amoxicillin and Keflex with no problems   Metronidazole Hives   Sulfa Antibiotics Rash    Outpatient Encounter Medications as of 03/18/2021  Medication Sig   albuterol (PROVENTIL HFA;VENTOLIN HFA) 108 (90 Base) MCG/ACT inhaler Inhale into the lungs as needed.    azelastine (ASTELIN) 0.1 % nasal spray Place 2 sprays into both nostrils 2 (two) times daily as needed for rhinitis. Use in each nostril as directed   cetirizine (ZYRTEC) 10 MG tablet Take 10 mg by mouth as needed.    fluticasone (CUTIVATE) 0.05 % cream as needed.   glycopyrrolate (ROBINUL) 2 MG tablet Take 1 tablet by mouth daily.   levothyroxine (SYNTHROID, LEVOTHROID) 112 MCG tablet Take 112 mcg by mouth daily before breakfast.    metoprolol succinate (TOPROL-XL) 100 MG 24 hr tablet Take 1 tablet (100 mg total) by mouth in the morning and at bedtime. Take with or immediately following a meal.   nitrofurantoin, macrocrystal-monohydrate, (MACROBID) 100 MG capsule Take 1 capsule (100 mg total) by mouth 2 (two) times daily.   sertraline (ZOLOFT) 100 MG tablet Take 100 mg by mouth 2 (two) times daily.   No facility-administered encounter medications on file as of 03/18/2021.     Past Medical History:  Diagnosis Date   Anxiety    Headache(784.0)    migraines hx   Hypothyroidism    Nasal sinus congestion    PONV (postoperative nausea and vomiting)     Past Surgical History:  Procedure Laterality Date   ABDOMINAL  HYSTERECTOMY     ANTERIOR CERVICAL DECOMP/DISCECTOMY FUSION N/A 02/02/2017   Procedure: ANTERIOR CERVICAL DECOMPRESSION/DISCECTOMY FUSION C5-6, C6-7;  Surgeon: Melina Schools, MD;  Location: [facility];  Service: Orthopedics;  Laterality: N/A;   BLADDER SUSPENSION  01/17/2012   Procedure: TRANSVAGINAL TAPE (TVT) PROCEDURE;  Surgeon: Olga Millers, MD;  Location: [facility];  Service: Gynecology;  Laterality: N/A;  with cystoscopy and excision of hemmorhoid   CESAREAN SECTION     SHOULDER ARTHROSCOPY Left    THYROIDECTOMY, PARTIAL     TONSILLECTOMY      Family History  Problem Relation Age of Onset   Dementia Mother    Heart attack Father    Atrial fibrillation Father    Heart attack Maternal Grandmother    Cervical cancer Maternal Grandmother    Emphysema Maternal Grandfather    Prostate cancer Maternal Grandfather    Breast cancer Paternal Grandmother    Hyperlipidemia Brother    Hypertension Brother     SH: No tobacco, no illicit drugs.  Review of Systems General: Denies fevers, chills, weight loss CV: Denies chest pain, shortness of breath, palpitations   Physical Exam Vitals with BMI 03/18/2021 11/06/2020 10/22/2020  Height 5\' 0"  - 5\' 1"   Weight 236 lbs - 236 lbs  BMI 93.73 - 42.87  Systolic 681 157 262  Diastolic 83 81 68  Pulse 77 98 85    General:  No acute distress,  Alert and oriented, Non-Toxic, Normal speech and affect Heent: 5 cm mass anterior neck inferior, appears to be relatively superficial.  Appears fatty and mobile.  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  Since this lesion is now larger would like to reimage her with CT scan and evaluate the mass.  Time based coding: 17 minutes were spent with the patient.  Greater than 50% was spent on counseling cordination of care.  We discussed neck masses differential and that depending on CT imaging it could be plastics versus ENT to remove this mass.   Alexis Knight 03/18/2021, 1:12 PM

## 2021-03-22 ENCOUNTER — Institutional Professional Consult (permissible substitution): Payer: Federal, State, Local not specified - PPO | Admitting: Plastic Surgery

## 2021-03-23 ENCOUNTER — Other Ambulatory Visit: Payer: Self-pay

## 2021-03-23 ENCOUNTER — Encounter (HOSPITAL_COMMUNITY): Payer: Self-pay

## 2021-03-23 ENCOUNTER — Ambulatory Visit (HOSPITAL_COMMUNITY)
Admission: RE | Admit: 2021-03-23 | Discharge: 2021-03-23 | Disposition: A | Discharge status: Home / Self Care | Payer: Federal, State, Local not specified - PPO | Source: Ambulatory Visit | Attending: Plastic Surgery | Admitting: Plastic Surgery

## 2021-03-23 DIAGNOSIS — J392 Other diseases of pharynx: Secondary | ICD-10-CM | POA: Diagnosis not present

## 2021-03-23 DIAGNOSIS — R221 Localized swelling, mass and lump, neck: Secondary | ICD-10-CM | POA: Diagnosis not present

## 2021-03-23 DIAGNOSIS — R220 Localized swelling, mass and lump, head: Secondary | ICD-10-CM | POA: Diagnosis not present

## 2021-03-23 DIAGNOSIS — E890 Postprocedural hypothyroidism: Secondary | ICD-10-CM | POA: Diagnosis not present

## 2021-03-23 MED ORDER — IOHEXOL 350 MG/ML SOLN
60.0000 mL | Freq: Once | INTRAVENOUS | Status: AC | PRN
  Administered 2021-03-23: 12:00:00 60 mL via INTRAVENOUS

## 2021-03-23 MED ORDER — SODIUM CHLORIDE (PF) 0.9 % IJ SOLN
INTRAMUSCULAR | Status: AC
  Filled 2021-03-23: qty 50

## 2021-03-25 ENCOUNTER — Telehealth: Payer: Self-pay

## 2021-03-25 NOTE — Telephone Encounter (Signed)
Patient called to say that she has had her CT scan and would like to know her next step.  Please call.

## 2021-03-31 NOTE — Addendum Note (Signed)
Addended by: Lennice Sites on: 03/31/2021 12:38 PM   Modules accepted: Orders

## 2021-04-15 ENCOUNTER — Other Ambulatory Visit: Payer: Self-pay | Admitting: Internal Medicine

## 2021-04-15 DIAGNOSIS — Z1231 Encounter for screening mammogram for malignant neoplasm of breast: Secondary | ICD-10-CM

## 2021-04-22 DIAGNOSIS — D181 Lymphangioma, any site: Secondary | ICD-10-CM | POA: Diagnosis not present

## 2021-04-22 DIAGNOSIS — F17210 Nicotine dependence, cigarettes, uncomplicated: Secondary | ICD-10-CM | POA: Diagnosis not present

## 2021-04-22 DIAGNOSIS — E669 Obesity, unspecified: Secondary | ICD-10-CM | POA: Diagnosis not present

## 2021-04-22 DIAGNOSIS — Z6841 Body Mass Index (BMI) 40.0 and over, adult: Secondary | ICD-10-CM | POA: Diagnosis not present

## 2021-04-30 ENCOUNTER — Ambulatory Visit
Admission: RE | Admit: 2021-04-30 | Discharge: 2021-04-30 | Disposition: A | Discharge status: Home / Self Care | Payer: Federal, State, Local not specified - PPO | Source: Ambulatory Visit

## 2021-04-30 DIAGNOSIS — Z1231 Encounter for screening mammogram for malignant neoplasm of breast: Secondary | ICD-10-CM

## 2021-05-12 NOTE — Progress Notes (Signed)
Surgical Instructions    Your procedure is scheduled on Wednesday February 15th.  Report to [facility] Main Entrance "A" at 10:15 A.M., then check in with the Admitting office.  Call this number if you have problems the morning of surgery:  [phone]   If you have any questions prior to your surgery date call [phone]: Open Monday-Friday 8am-4pm    Remember:  Do not eat after midnight the night before your surgery  You may drink clear liquids until 9:15am the morning of your surgery.   Clear liquids allowed are: Water, Non-Citrus Juices (without pulp), Carbonated Beverages, Clear Tea, Black Coffee ONLY (NO MILK, CREAM OR POWDERED CREAMER of any kind), and Gatorade    Take these medicines the morning of surgery with A SIP OF WATER: levothyroxine (SYNTHROID, LEVOTHROID) 112 MCG tablet metoprolol succinate (TOPROL-XL) 100 MG 24 hr tablet sertraline (ZOLOFT) 100 MG tablet  IF NEEDED albuterol (PROVENTIL HFA;VENTOLIN HFA) 108 (90 Base) MCG/ACT inhaler - please bring with you to the hospital azelastine (ASTELIN) 0.1 % nasal spray cetirizine (ZYRTEC) 10 MG tablet    As of today, STOP taking any Aspirin (unless otherwise instructed by your surgeon) Aleve, Naproxen, Ibuprofen, Motrin, Advil, Goody's, BC's, all herbal medications, fish oil, and all vitamins.           Do not wear jewelry or makeup Do not wear lotions, powders, perfumes, or deodorant. Do not shave 48 hours prior to surgery.   Do not bring valuables to the hospital. Do not wear nail polish, gel polish, artificial nails, or any other type of covering on natural nails (fingers and toes) If you have artificial nails or gel coating that need to be removed by a nail salon, please have this removed prior to surgery. Artificial nails or gel coating may interfere with anesthesia's ability to adequately monitor your vital signs.  [facility] is not responsible for any belongings or valuables. .   Do NOT Smoke  (Tobacco/Vaping)  24 hours prior to your procedure  If you use a CPAP at night, you may bring your mask for your overnight stay.   Contacts, glasses, hearing aids, dentures or partials may not be worn into surgery, please bring cases for these belongings   For patients admitted to the hospital, discharge time will be determined by your treatment team.   Patients discharged the day of surgery will not be allowed to drive home, and someone needs to stay with them for 24 hours.  NO VISITORS WILL BE ALLOWED IN PRE-OP WHERE PATIENTS ARE PREPPED FOR SURGERY.  ONLY 1 SUPPORT PERSON MAY BE PRESENT IN THE WAITING ROOM WHILE YOU ARE IN SURGERY.  IF YOU ARE TO BE ADMITTED, ONCE YOU ARE IN YOUR ROOM YOU WILL BE ALLOWED TWO (2) VISITORS. 1 (ONE) VISITOR MAY STAY OVERNIGHT BUT MUST ARRIVE TO THE ROOM BY 8pm.  Minor children may have two parents present. Special consideration for safety and communication needs will be reviewed on a case by case basis.  Special instructions:    Oral Hygiene is also important to reduce your risk of infection.  Remember - BRUSH YOUR TEETH THE MORNING OF SURGERY WITH YOUR REGULAR TOOTHPASTE   [facility]- Preparing For Surgery  Before surgery, you can play an important role. Because skin is not sterile, your skin needs to be as free of germs as possible. You can reduce the number of germs on your skin by washing with CHG (chlorahexidine gluconate) Soap before surgery.  CHG is an antiseptic cleaner  which kills germs and bonds with the skin to continue killing germs even after washing.     Please do not use if you have an allergy to CHG or antibacterial soaps. If your skin becomes reddened/irritated stop using the CHG.  Do not shave (including legs and underarms) for at least 48 hours prior to first CHG shower. It is OK to shave your face.  Please follow these instructions carefully.     Shower the NIGHT BEFORE SURGERY and the MORNING OF SURGERY with CHG Soap.   If you chose  to wash your hair, wash your hair first as usual with your normal shampoo. After you shampoo, rinse your hair and body thoroughly to remove the shampoo.  Then [employer] and genitals (private parts) with your normal soap and rinse thoroughly to remove soap.  After that Use CHG Soap as you would any other liquid soap. You can apply CHG directly to the skin and wash gently with a scrungie or a clean washcloth.   Apply the CHG Soap to your body ONLY FROM THE NECK DOWN.  Do not use on open wounds or open sores. Avoid contact with your eyes, ears, mouth and genitals (private parts). Wash Face and genitals (private parts)  with your normal soap.   Wash thoroughly, paying special attention to the area where your surgery will be performed.  Thoroughly rinse your body with warm water from the neck down.  DO NOT shower/wash with your normal soap after using and rinsing off the CHG Soap.  Pat yourself dry with a CLEAN TOWEL.  Wear CLEAN PAJAMAS to bed the night before surgery  Place CLEAN SHEETS on your bed the night before your surgery  DO NOT SLEEP WITH PETS.   Day of Surgery:  Take a shower with CHG soap. Wear Clean/Comfortable clothing the morning of surgery Do not apply any deodorants/lotions.   Remember to brush your teeth WITH YOUR REGULAR TOOTHPASTE.    COVID testing  If you are going to stay overnight or be admitted after your procedure/surgery and require a pre-op COVID test, please follow these instructions after your COVID test   You are not required to quarantine however you are required to wear a well-fitting mask when you are out and around people not in your household.  If your mask becomes wet or soiled, replace with a new one.  Wash your hands often with soap and water for 20 seconds or clean your hands with an alcohol-based hand sanitizer that contains at least 60% alcohol.  Do not share personal items.  Notify your provider: if you are in close contact with someone  who has COVID  or if you develop a fever of 100.4 or greater, sneezing, cough, sore throat, shortness of breath or body aches.    Please read over the following fact sheets that you were given.

## 2021-05-12 NOTE — H&P (Signed)
HPI:   Alexis Knight is a 61 y.o. female who presents as a consult Patient.   Referring Provider: Henrine Screws, MD  Chief complaint: Neck mass.  HPI: She had a neck mass in the right anterior neck for many years. Recently it has started getting larger. She has a history of thyroid lobectomy years ago for benign disease and a history of some other benign lesion that was removed from her neck that as far she recalls was adjacent to her jugular vein. She does not smoke. She does not have any snoring or sleep apnea issues.  PMH/Meds/All/SocHx/FamHx/ROS:   Past Medical History:  Diagnosis Date   Hypothyroidism   Migraine headache   Past Surgical History:  Procedure Laterality Date   CESAREAN SECTION   CHOLECYSTECTOMY   HYSTERECTOMY   NEUROPLASTY / TRANSPOSITION MEDIAN NERVE AT CARPAL TUNNEL   SHOULDER SURGERY   THYROID SURGERY   TONSILLECTOMY   WISDOM TOOTH EXTRACTION   No family history of bleeding disorders, wound healing problems or difficulty with anesthesia.   Social History   Socioeconomic History   Marital status: Married  Spouse name: Not on file   Number of children: Not on file   Years of education: Not on file   Highest education level: Not on file  Occupational History   Not on file  Tobacco Use   Smoking status: Some Days  Types: Cigarettes   Smokeless tobacco: Not on file  Substance and Sexual Activity   Alcohol use: Yes   Drug use: Not on file   Sexual activity: Not on file  Other Topics Concern   Not on file  Social History Narrative   Not on file   Social Determinants of Health   Financial Resource Strain: Not on file  Food Insecurity: Not on file  Transportation Needs: Not on file  Physical Activity: Not on file  Stress: Not on file  Social Connections: Not on file  Housing Stability: Not on file   Current Outpatient Medications:   cetirizine (ZYRTEC) 10 MG tablet, Take 1 tablet (10 mg total) by mouth., Disp: , Rfl:   clobetasol  (TEMOVATE) 0.05 % cream, , Disp: , Rfl:   levothyroxine (SYNTHROID, LEVOTHROID) 125 MCG tablet, Take 1 tablet (125 mcg total) by mouth., Disp: , Rfl:   metoPROLOL succinate (TOPROL-XL) 100 MG 24 hr tablet, metoprolol succinate ER 100 mg tablet,extended release 24 hr, Disp: , Rfl:   multivitamin,tx-minerals Tab, Take 1 tablet by mouth., Disp: , Rfl:   sertraline (ZOLOFT) 50 MG tablet, , Disp: , Rfl:   glycopyrrolate (ROBINUL) 2 MG tablet, , Disp: , Rfl:   promethazine (PHENERGAN) 25 MG tablet, , Disp: , Rfl:   topiramate (TOPAMAX) 25 MG tablet, 4 tablets (100 mg total) daily., Disp: , Rfl:   A complete ROS was performed with pertinent positives/negatives noted in the HPI. The remainder of the ROS are negative.   Physical Exam:   Temp 97.8 F (36.6 C)   Ht 1.524 m (5')   Wt 108.6 kg (239 lb 6.4 oz)   BMI 46.75 kg/m   General: Obese lady, in no distress, breathing easily. Normal affect. In a pleasant mood. Head: Normocephalic, atraumatic. No masses, or scars. Eyes: Pupils are equal, and reactive to light. Vision is grossly intact. No spontaneous or gaze nystagmus. Ears: Ear canals are clear. Tympanic membranes are intact, with normal landmarks and the middle ears are clear and healthy. Hearing: Grossly normal. Nose: Nasal cavities are clear with healthy mucosa, no  polyps or exudate. Airways are patent. Face: No masses or scars, facial nerve function is symmetric. Oral Cavity: No mucosal abnormalities are noted. Tongue with normal mobility. Dentition appears healthy. Oropharynx: Tonsils are symmetric. There are no mucosal masses identified. Tongue base appears normal and healthy. Larynx/Hypopharynx: deferred Chest: Deferred Neck: There are 2 transverse scars in the neck, 1 atypical thyroidectomy scar and the second 1 about 4 cm superior to that and centered a little bit to the right. Above the upper scar there is a soft lipomatous mass that is approximately 8 cm in transverse dimension.  There are no other masses or adenopathy palpable. Neuro: Cranial nerves II-XII with normal function. Balance: Normal gate. Other findings: none.  Independent Review of Additional Tests or Records:  CT scan of neck from December 2022  IMPRESSION:  1. Constellation of imaging reports and findings since 2003  consistent with recurrence of a previously resected right lower neck  trans-spatial mass lesion, which was favored on imaging in 2003 to  be a lymphangioma.   Residual of that lesion was subtle by CT in 2011, and in 2017 was  not mass-like although encompassed up to 4 cm (favored to be  granulation tissue).   But the lesion now is substantially more lobulated, mass-like, and  has increased up to 6.2 cm long axis.  Once again the trans-spatial, intermediate density appearance with  lack of regional mass effect argue in favor of a lymphangioma or  similar benign etiology.  Recommend Surgery consultation to consider re-resection.   2. No associated lymphadenopathy. And otherwise stable Neck CT since  2017 except for interval cervical ACDF.   Procedures:  none  Impression & Plans:  Neck mass, clinically consistent with lipoma. She had imaging recently which was read as lymphangioma since this is getting larger and becoming a significant cosmetic issue, recommend surgical removal. I could probably use the upper scar as an incision but would need to extend it to the left. I would plan to keep her overnight at the hospital and would likely keep the drain in for 3 or 4 days. She understands and wants to have this done.

## 2021-05-13 ENCOUNTER — Encounter (HOSPITAL_COMMUNITY)
Admission: RE | Admit: 2021-05-13 | Discharge: 2021-05-13 | Disposition: A | Discharge status: Home / Self Care | Payer: Federal, State, Local not specified - PPO | Source: Ambulatory Visit | Attending: Otolaryngology | Admitting: Otolaryngology

## 2021-05-13 ENCOUNTER — Other Ambulatory Visit: Payer: Self-pay

## 2021-05-13 ENCOUNTER — Encounter (HOSPITAL_COMMUNITY): Payer: Self-pay

## 2021-05-13 VITALS — BP 139/77 | HR 88 | Temp 98.8°F | Resp 18 | Ht 60.0 in | Wt 237.0 lb

## 2021-05-13 DIAGNOSIS — D170 Benign lipomatous neoplasm of skin and subcutaneous tissue of head, face and neck: Secondary | ICD-10-CM | POA: Diagnosis not present

## 2021-05-13 DIAGNOSIS — Z01818 Encounter for other preprocedural examination: Secondary | ICD-10-CM

## 2021-05-13 DIAGNOSIS — Z01812 Encounter for preprocedural laboratory examination: Secondary | ICD-10-CM | POA: Diagnosis not present

## 2021-05-13 HISTORY — DX: Ventricular premature depolarization: I49.3

## 2021-05-13 LAB — CBC
HCT: 42.5 % (ref 36.0–46.0)
Hemoglobin: 13.2 g/dL (ref 12.0–15.0)
MCH: 26.5 pg (ref 26.0–34.0)
MCHC: 31.1 g/dL (ref 30.0–36.0)
MCV: 85.3 fL (ref 80.0–100.0)
Platelets: 299 10*3/uL (ref 150–400)
RBC: 4.98 MIL/uL (ref 3.87–5.11)
RDW: 14.2 % (ref 11.5–15.5)
WBC: 8.2 10*3/uL (ref 4.0–10.5)
nRBC: 0 % (ref 0.0–0.2)

## 2021-05-13 NOTE — Progress Notes (Addendum)
PCP -Delmer Islam Gates<MD  Cardiologist - Verlin Dike                       Will Camnitz,MD  PPM/ICD - denies Device Orders -  Rep Notified -   Chest x-ray -  EKG - 10/22/20 Stress Test - 08/29/17 ECHO -10/28/19  Cardiac Cath - no  Sleep Study - no CPAP - no  Fasting Blood Sugar - n/a Checks Blood Sugar n/a times a day  Blood Thinner Instructions:n/a Aspirin Instructions:n/a  ERAS Protcol -no PRE-SURGERY Ensure or G2-   COVID TEST- Too early for patient to have Covid test at 2/9 PAT appointment for surgery 2/15. She will be out of town on Monday 2/13 and Tuesday 2/14 so she will need Covid test DOS.   Anesthesia review: yes-history of frequent PVC's  Patient denies shortness of breath, fever, cough and chest pain at PAT appointment   All instructions explained to the patient, with a verbal understanding of the material. Patient agrees to go over the instructions while at home for a better understanding. Patient instructed to wear a mask while out in public from now until her surgery. The opportunity to ask questions was provided.

## 2021-05-13 NOTE — Progress Notes (Signed)
Surgical Instructions    Your procedure is scheduled on Wednesday February 15th.  Report to [facility] Main Entrance "A" at 10:15 A.M., then check in with the Admitting office.  Call this number if you have problems the morning of surgery:  [phone]   If you have any questions prior to your surgery date call [phone]: Open Monday-Friday 8am-4pm    Remember:  Do not eat or drink after midnight the night before your surgery      Take these medicines the morning of surgery with A SIP OF WATER: levothyroxine (SYNTHROID, LEVOTHROID) 112 MCG tablet metoprolol succinate (TOPROL-XL) 100 MG 24 hr tablet sertraline (ZOLOFT) 100 MG tablet  IF NEEDED albuterol (PROVENTIL HFA;VENTOLIN HFA) 108 (90 Base) MCG/ACT inhaler - please bring with you to the hospital azelastine (ASTELIN) 0.1 % nasal spray cetirizine (ZYRTEC) 10 MG tablet    As of today, STOP taking any Aspirin (unless otherwise instructed by your surgeon) Aleve, Naproxen, Ibuprofen, Motrin, Advil, Goody's, BC's, all herbal medications, fish oil, and all vitamins.           Do not wear jewelry or makeup Do not wear lotions, powders, perfumes, or deodorant. Do not shave 48 hours prior to surgery.   Do not bring valuables to the hospital. Do not wear nail polish, gel polish, artificial nails, or any other type of covering on natural nails (fingers and toes) If you have artificial nails or gel coating that need to be removed by a nail salon, please have this removed prior to surgery. Artificial nails or gel coating may interfere with anesthesia's ability to adequately monitor your vital signs.  [facility] is not responsible for any belongings or valuables. .   Do NOT Smoke (Tobacco/Vaping)  24 hours prior to your procedure  If you use a CPAP at night, you may bring your mask for your overnight stay.   Contacts, glasses, hearing aids, dentures or partials may not be worn into surgery, please bring cases for these  belongings   For patients admitted to the hospital, discharge time will be determined by your treatment team.   Patients discharged the day of surgery will not be allowed to drive home, and someone needs to stay with them for 24 hours.  NO VISITORS WILL BE ALLOWED IN PRE-OP WHERE PATIENTS ARE PREPPED FOR SURGERY.  ONLY 1 SUPPORT PERSON MAY BE PRESENT IN THE WAITING ROOM WHILE YOU ARE IN SURGERY.  IF YOU ARE TO BE ADMITTED, ONCE YOU ARE IN YOUR ROOM YOU WILL BE ALLOWED TWO (2) VISITORS. 1 (ONE) VISITOR MAY STAY OVERNIGHT BUT MUST ARRIVE TO THE ROOM BY 8pm.  Minor children may have two parents present. Special consideration for safety and communication needs will be reviewed on a case by case basis.  Special instructions:    Oral Hygiene is also important to reduce your risk of infection.  Remember - BRUSH YOUR TEETH THE MORNING OF SURGERY WITH YOUR REGULAR TOOTHPASTE   [facility]- Preparing For Surgery  Before surgery, you can play an important role. Because skin is not sterile, your skin needs to be as free of germs as possible. You can reduce the number of germs on your skin by washing with CHG (chlorahexidine gluconate) Soap before surgery.  CHG is an antiseptic cleaner which kills germs and bonds with the skin to continue killing germs even after washing.     Please do not use if you have an allergy to CHG or antibacterial soaps. If your skin becomes reddened/irritated  stop using the CHG.  Do not shave (including legs and underarms) for at least 48 hours prior to first CHG shower. It is OK to shave your face.  Please follow these instructions carefully.     Shower the NIGHT BEFORE SURGERY and the MORNING OF SURGERY with CHG Soap.   If you chose to wash your hair, wash your hair first as usual with your normal shampoo. After you shampoo, rinse your hair and body thoroughly to remove the shampoo.  Then [employer] and genitals (private parts) with your normal soap and rinse thoroughly to  remove soap.  After that Use CHG Soap as you would any other liquid soap. You can apply CHG directly to the skin and wash gently with a scrungie or a clean washcloth.   Apply the CHG Soap to your body ONLY FROM THE NECK DOWN.  Do not use on open wounds or open sores. Avoid contact with your eyes, ears, mouth and genitals (private parts). Wash Face and genitals (private parts)  with your normal soap.   Wash thoroughly, paying special attention to the area where your surgery will be performed.  Thoroughly rinse your body with warm water from the neck down.  DO NOT shower/wash with your normal soap after using and rinsing off the CHG Soap.  Pat yourself dry with a CLEAN TOWEL.  Wear CLEAN PAJAMAS to bed the night before surgery  Place CLEAN SHEETS on your bed the night before your surgery  DO NOT SLEEP WITH PETS.   Day of Surgery:  Take a shower with CHG soap. Wear Clean/Comfortable clothing the morning of surgery Do not apply any deodorants/lotions.   Remember to brush your teeth WITH YOUR REGULAR TOOTHPASTE.    COVID testing  If you are going to stay overnight or be admitted after your procedure/surgery and require a pre-op COVID test, please follow these instructions after your COVID test   You are not required to quarantine however you are required to wear a well-fitting mask when you are out and around people not in your household.  If your mask becomes wet or soiled, replace with a new one.  Wash your hands often with soap and water for 20 seconds or clean your hands with an alcohol-based hand sanitizer that contains at least 60% alcohol.  Do not share personal items.  Notify your provider: if you are in close contact with someone who has COVID  or if you develop a fever of 100.4 or greater, sneezing, cough, sore throat, shortness of breath or body aches.    Please read over the following fact sheets that you were given.

## 2021-05-14 ENCOUNTER — Encounter (HOSPITAL_COMMUNITY): Payer: Self-pay

## 2021-05-14 NOTE — Progress Notes (Signed)
Anesthesia Chart Review:   Case: [id number] Date/Time: 05/19/21 1200   Procedure: RESECTION OF NECK MASS   Anesthesia type: General   Pre-op diagnosis: Lipoma of neck   Location: [facility] OR ROOM 02 / [facility]   Surgeons: Izora Gala, MD       DISCUSSION: Pt is 61 years old with hx PVCs (controlled by metoprolol)  VS: BP 139/77    Pulse 88    Temp 37.1 C (Oral)    Resp 18    Ht 5' (1.524 m)    Wt 107.5 kg    SpO2 98%    BMI 46.29 kg/m   PROVIDERS: - PCP is Josetta Huddle, MD - EP Cardiologist is Allegra Lai, MD. Last office visit 10/22/20   LABS: Labs reviewed: Acceptable for surgery. - Will get BMP day of surgery  (all labs ordered are listed, but only abnormal results are displayed)  Labs Reviewed  CBC    EKG 10/22/20: NSR   CV: CT cardiac scoring 11/21/19:   - Coronary calcium score of 0.  Cardiac event monitor 11/11/19:  - Predominant rhythm sinus rhythm - No atrial fibrillation noted - 3% PVCs - Symptoms of palpitations, rapid heart rate, chest pain associated with sinus rhythm and sinus tachycardia - No other arrhythmias noted  Echo 10/28/19:  1. Left ventricular ejection fraction, by estimation, is 55 to 60%. The left ventricle has normal function. Left ventricular endocardial border not optimally defined to evaluate regional wall motion. Left ventricular diastolic parameters were normal.   2. Right ventricular systolic function is normal. The right ventricular size is normal. There is normal pulmonary artery systolic pressure.   3. The mitral valve is normal in structure. Mild mitral valve  regurgitation.   4. The aortic valve is normal in structure. Aortic valve regurgitation is not visualized.   5. The inferior vena cava is normal in size with greater than 50% respiratory variability, suggesting right atrial pressure of 3 mmHg.  Nuclear stress test 08/30/17:  Nuclear stress EF: 50%. The left ventricular ejection fraction is mildly decreased (45-54%). This is a low risk  study. The study is normal. No evidence of ischemia or infarction  Past Medical History:  Diagnosis Date   Anxiety    Headache(784.0)    migraines hx   Hypothyroidism    Nasal sinus congestion    PONV (postoperative nausea and vomiting)    PVC's (premature ventricular contractions)     Past Surgical History:  Procedure Laterality Date   ABDOMINAL HYSTERECTOMY     ANTERIOR CERVICAL DECOMP/DISCECTOMY FUSION N/A 02/02/2017   Procedure: ANTERIOR CERVICAL DECOMPRESSION/DISCECTOMY FUSION C5-6, C6-7;  Surgeon: Melina Schools, MD;  Location: [facility];  Service: Orthopedics;  Laterality: N/A;   BLADDER SUSPENSION  01/17/2012   Procedure: TRANSVAGINAL TAPE (TVT) PROCEDURE;  Surgeon: Olga Millers, MD;  Location: [facility];  Service: Gynecology;  Laterality: N/A;  with cystoscopy and excision of hemmorhoid   CESAREAN SECTION     REDUCTION MAMMAPLASTY Bilateral    SHOULDER ARTHROSCOPY Left    THYROIDECTOMY, PARTIAL     TONSILLECTOMY      MEDICATIONS:  albuterol (PROVENTIL HFA;VENTOLIN HFA) 108 (90 Base) MCG/ACT inhaler   azelastine (ASTELIN) 0.1 % nasal spray   cetirizine (ZYRTEC) 10 MG tablet   cholecalciferol (VITAMIN D3) 25 MCG (1000 UNIT) tablet   CRANBERRY PO   fluticasone (CUTIVATE) 3.15 % cream   folic acid (FOLVITE) 1 MG tablet   levothyroxine (SYNTHROID, LEVOTHROID) 112 MCG tablet   metoprolol succinate (  TOPROL-XL) 100 MG 24 hr tablet   Multiple Vitamins-Minerals (MULTIVITAMIN WITH MINERALS) tablet   sertraline (ZOLOFT) 100 MG tablet   No current facility-administered medications for this encounter.   Will get BMP day of surgery. If labs acceptable day of surgery, I anticipate pt can proceed with surgery as scheduled.  Willeen Cass, PhD, FNP-BC [facility] Short Stay Surgical Center/Anesthesiology Phone: [phone] 05/14/2021 1:20 PM

## 2021-05-18 NOTE — Progress Notes (Signed)
Pt made aware of surgery time change. Pt will still arrive at 1015, as she needs  a Covid test prior to surgery.

## 2021-05-19 ENCOUNTER — Ambulatory Visit (HOSPITAL_COMMUNITY): Payer: Federal, State, Local not specified - PPO | Admitting: Emergency Medicine

## 2021-05-19 ENCOUNTER — Other Ambulatory Visit: Payer: Self-pay

## 2021-05-19 ENCOUNTER — Encounter (HOSPITAL_COMMUNITY)
Admission: AD | Disposition: A | Discharge status: Home / Self Care | Payer: Self-pay | Source: Home / Self Care | Attending: Otolaryngology

## 2021-05-19 ENCOUNTER — Observation Stay (HOSPITAL_COMMUNITY)
Admission: AD | Admit: 2021-05-19 | Discharge: 2021-05-21 | Disposition: A | Discharge status: Home / Self Care | Payer: Federal, State, Local not specified - PPO | Attending: Otolaryngology | Admitting: Otolaryngology

## 2021-05-19 ENCOUNTER — Encounter (HOSPITAL_COMMUNITY): Payer: Self-pay | Admitting: Otolaryngology

## 2021-05-19 DIAGNOSIS — Q899 Congenital malformation, unspecified: Secondary | ICD-10-CM

## 2021-05-19 DIAGNOSIS — K1123 Chronic sialoadenitis: Secondary | ICD-10-CM | POA: Diagnosis not present

## 2021-05-19 DIAGNOSIS — I493 Ventricular premature depolarization: Secondary | ICD-10-CM

## 2021-05-19 DIAGNOSIS — Z79890 Hormone replacement therapy (postmenopausal): Secondary | ICD-10-CM | POA: Diagnosis not present

## 2021-05-19 DIAGNOSIS — F17210 Nicotine dependence, cigarettes, uncomplicated: Secondary | ICD-10-CM | POA: Insufficient documentation

## 2021-05-19 DIAGNOSIS — R221 Localized swelling, mass and lump, neck: Secondary | ICD-10-CM | POA: Diagnosis not present

## 2021-05-19 DIAGNOSIS — E039 Hypothyroidism, unspecified: Secondary | ICD-10-CM | POA: Insufficient documentation

## 2021-05-19 DIAGNOSIS — Z20822 Contact with and (suspected) exposure to covid-19: Secondary | ICD-10-CM | POA: Diagnosis not present

## 2021-05-19 DIAGNOSIS — D170 Benign lipomatous neoplasm of skin and subcutaneous tissue of head, face and neck: Secondary | ICD-10-CM | POA: Diagnosis not present

## 2021-05-19 DIAGNOSIS — F419 Anxiety disorder, unspecified: Secondary | ICD-10-CM | POA: Diagnosis not present

## 2021-05-19 HISTORY — PX: EXCISION MASS NECK: SHX6703

## 2021-05-19 LAB — BASIC METABOLIC PANEL
Anion gap: 10 (ref 5–15)
BUN: 12 mg/dL (ref 6–20)
CO2: 25 mmol/L (ref 22–32)
Calcium: 9.1 mg/dL (ref 8.9–10.3)
Chloride: 105 mmol/L (ref 98–111)
Creatinine, Ser: 0.72 mg/dL (ref 0.44–1.00)
GFR, Estimated: >60 mL/min (ref >60)
Glucose, Bld: 136 mg/dL — ABNORMAL HIGH (ref 70–99)
Potassium: 3.9 mmol/L (ref 3.5–5.1)
Sodium: 140 mmol/L (ref 135–145)

## 2021-05-19 LAB — SARS CORONAVIRUS 2 BY RT PCR (HOSPITAL ORDER, PERFORMED IN ~~LOC~~ HOSPITAL LAB): SARS Coronavirus 2: NEGATIVE

## 2021-05-19 SURGERY — EXCISION, MASS, NECK
Anesthesia: General | Site: Neck | Laterality: Right

## 2021-05-19 MED ORDER — ONDANSETRON HCL 4 MG/2ML IJ SOLN: 4.0000 mg | Freq: Once | INTRAMUSCULAR | Status: DC | PRN

## 2021-05-19 MED ORDER — LIDOCAINE 2% (20 MG/ML) 5 ML SYRINGE
INTRAMUSCULAR | Status: AC
  Filled 2021-05-19: qty 5

## 2021-05-19 MED ORDER — ROCURONIUM BROMIDE 10 MG/ML (PF) SYRINGE
PREFILLED_SYRINGE | INTRAVENOUS | Status: AC
  Filled 2021-05-19: qty 20

## 2021-05-19 MED ORDER — SERTRALINE HCL 100 MG PO TABS
100.0000 mg | ORAL_TABLET | Freq: Two times a day (BID) | ORAL | Status: DC
  Administered 2021-05-19 – 2021-05-21 (×4): 100 mg via ORAL
  Filled 2021-05-19 (×4): qty 1

## 2021-05-19 MED ORDER — DEXAMETHASONE SODIUM PHOSPHATE 10 MG/ML IJ SOLN
INTRAMUSCULAR | Status: DC | PRN
Start: 2021-05-19 — End: 2021-05-19
  Administered 2021-05-19: 10 mg via INTRAVENOUS

## 2021-05-19 MED ORDER — PROPOFOL 10 MG/ML IV BOLUS
INTRAVENOUS | Status: DC | PRN
  Administered 2021-05-19: 200 mg via INTRAVENOUS

## 2021-05-19 MED ORDER — PHENYLEPHRINE HCL-NACL 20-0.9 MG/250ML-% IV SOLN
INTRAVENOUS | Status: DC | PRN
  Administered 2021-05-19: 20 ug/min via INTRAVENOUS

## 2021-05-19 MED ORDER — MIDAZOLAM HCL 2 MG/2ML IJ SOLN
INTRAMUSCULAR | Status: DC | PRN
Start: 2021-05-19 — End: 2021-05-19
  Administered 2021-05-19: 2 mg via INTRAVENOUS

## 2021-05-19 MED ORDER — LEVOTHYROXINE SODIUM 112 MCG PO TABS
112.0000 ug | ORAL_TABLET | Freq: Every day | ORAL | Status: DC
  Administered 2021-05-20 – 2021-05-21 (×2): 112 ug via ORAL
  Filled 2021-05-19 (×2): qty 1

## 2021-05-19 MED ORDER — IBUPROFEN 100 MG/5ML PO SUSP: 400.0000 mg | Freq: Four times a day (QID) | ORAL | Status: DC | PRN

## 2021-05-19 MED ORDER — ROCURONIUM BROMIDE 10 MG/ML (PF) SYRINGE
PREFILLED_SYRINGE | INTRAVENOUS | Status: DC | PRN
Start: 2021-05-19 — End: 2021-05-19
  Administered 2021-05-19: 60 mg via INTRAVENOUS

## 2021-05-19 MED ORDER — LORATADINE 10 MG PO TABS
10.0000 mg | ORAL_TABLET | Freq: Every day | ORAL | Status: DC
  Administered 2021-05-20 – 2021-05-21 (×2): 10 mg via ORAL
  Filled 2021-05-19 (×2): qty 1

## 2021-05-19 MED ORDER — SCOPOLAMINE 1 MG/3DAYS TD PT72: 1.0000 | MEDICATED_PATCH | TRANSDERMAL | Status: DC

## 2021-05-19 MED ORDER — ONDANSETRON HCL 4 MG/2ML IJ SOLN
INTRAMUSCULAR | Status: AC
  Filled 2021-05-19: qty 2

## 2021-05-19 MED ORDER — FENTANYL CITRATE (PF) 250 MCG/5ML IJ SOLN
INTRAMUSCULAR | Status: AC
  Filled 2021-05-19: qty 5

## 2021-05-19 MED ORDER — HYDROCODONE-ACETAMINOPHEN 5-325 MG PO TABS
1.0000 | ORAL_TABLET | ORAL | Status: DC | PRN
  Administered 2021-05-19 – 2021-05-21 (×7): 2 via ORAL
  Filled 2021-05-19 (×7): qty 2

## 2021-05-19 MED ORDER — ONDANSETRON HCL 4 MG/2ML IJ SOLN
INTRAMUSCULAR | Status: DC | PRN
  Administered 2021-05-19: 4 mg via INTRAVENOUS

## 2021-05-19 MED ORDER — PROPOFOL 10 MG/ML IV BOLUS
INTRAVENOUS | Status: AC
  Filled 2021-05-19: qty 20

## 2021-05-19 MED ORDER — LACTATED RINGERS IV SOLN: INTRAVENOUS | Status: DC

## 2021-05-19 MED ORDER — SCOPOLAMINE 1 MG/3DAYS TD PT72
MEDICATED_PATCH | TRANSDERMAL | Status: AC
  Administered 2021-05-19: 1.5 mg via TRANSDERMAL
  Filled 2021-05-19: qty 1

## 2021-05-19 MED ORDER — ORAL CARE MOUTH RINSE: 15.0000 mL | Freq: Once | OROMUCOSAL | Status: AC

## 2021-05-19 MED ORDER — BACITRACIN ZINC 500 UNIT/GM EX OINT
TOPICAL_OINTMENT | CUTANEOUS | Status: DC | PRN
  Administered 2021-05-19: 1 via TOPICAL

## 2021-05-19 MED ORDER — AZELASTINE HCL 0.1 % NA SOLN: 2.0000 | Freq: Two times a day (BID) | NASAL | Status: DC | PRN

## 2021-05-19 MED ORDER — BACITRACIN ZINC 500 UNIT/GM EX OINT
TOPICAL_OINTMENT | CUTANEOUS | Status: AC
  Filled 2021-05-19: qty 28.35

## 2021-05-19 MED ORDER — METOPROLOL SUCCINATE ER 25 MG PO TB24
50.0000 mg | ORAL_TABLET | Freq: Every day | ORAL | Status: DC
  Administered 2021-05-21: 50 mg via ORAL
  Filled 2021-05-19 (×2): qty 2

## 2021-05-19 MED ORDER — VITAMIN D 25 MCG (1000 UNIT) PO TABS
1000.0000 [IU] | ORAL_TABLET | Freq: Every day | ORAL | Status: DC
  Administered 2021-05-20 – 2021-05-21 (×2): 1000 [IU] via ORAL
  Filled 2021-05-19 (×2): qty 1

## 2021-05-19 MED ORDER — CHLORHEXIDINE GLUCONATE 0.12 % MT SOLN: 15.0000 mL | Freq: Once | OROMUCOSAL | Status: AC

## 2021-05-19 MED ORDER — SUGAMMADEX SODIUM 200 MG/2ML IV SOLN
INTRAVENOUS | Status: DC | PRN
  Administered 2021-05-19 (×2): 200 mg via INTRAVENOUS

## 2021-05-19 MED ORDER — FOLIC ACID 1 MG PO TABS
1.0000 mg | ORAL_TABLET | Freq: Every day | ORAL | Status: DC
  Administered 2021-05-20 – 2021-05-21 (×2): 1 mg via ORAL
  Filled 2021-05-19 (×2): qty 1

## 2021-05-19 MED ORDER — ADULT MULTIVITAMIN W/MINERALS CH
1.0000 | ORAL_TABLET | Freq: Every day | ORAL | Status: DC
  Administered 2021-05-20 – 2021-05-21 (×2): 1 via ORAL
  Filled 2021-05-19 (×2): qty 1

## 2021-05-19 MED ORDER — ROCURONIUM BROMIDE 10 MG/ML (PF) SYRINGE
PREFILLED_SYRINGE | INTRAVENOUS | Status: AC
  Filled 2021-05-19: qty 10

## 2021-05-19 MED ORDER — FENTANYL CITRATE (PF) 250 MCG/5ML IJ SOLN
INTRAMUSCULAR | Status: DC | PRN
  Administered 2021-05-19: 150 ug via INTRAVENOUS
  Administered 2021-05-19: 25 ug via INTRAVENOUS
  Administered 2021-05-19 (×2): 50 ug via INTRAVENOUS
  Administered 2021-05-19 (×2): 25 ug via INTRAVENOUS

## 2021-05-19 MED ORDER — BACITRACIN ZINC 500 UNIT/GM EX OINT
1.0000 "application " | TOPICAL_OINTMENT | Freq: Three times a day (TID) | CUTANEOUS | Status: DC
  Administered 2021-05-19 – 2021-05-21 (×5): 1 via TOPICAL
  Filled 2021-05-19: qty 28.35

## 2021-05-19 MED ORDER — POTASSIUM CHLORIDE 2 MEQ/ML IV SOLN
INTRAVENOUS | Status: DC
  Filled 2021-05-19 (×3): qty 1000

## 2021-05-19 MED ORDER — DEXAMETHASONE SODIUM PHOSPHATE 10 MG/ML IJ SOLN
INTRAMUSCULAR | Status: AC
  Filled 2021-05-19: qty 1

## 2021-05-19 MED ORDER — LIDOCAINE-EPINEPHRINE 1 %-1:100000 IJ SOLN
INTRAMUSCULAR | Status: AC
  Filled 2021-05-19: qty 1

## 2021-05-19 MED ORDER — CHLORHEXIDINE GLUCONATE 0.12 % MT SOLN
OROMUCOSAL | Status: AC
  Administered 2021-05-19: 15 mL via OROMUCOSAL
  Filled 2021-05-19: qty 15

## 2021-05-19 MED ORDER — 0.9 % SODIUM CHLORIDE (POUR BTL) OPTIME
TOPICAL | Status: DC | PRN
Start: 2021-05-19 — End: 2021-05-19
  Administered 2021-05-19: 200 mL

## 2021-05-19 MED ORDER — PHENYLEPHRINE 40 MCG/ML (10ML) SYRINGE FOR IV PUSH (FOR BLOOD PRESSURE SUPPORT)
PREFILLED_SYRINGE | INTRAVENOUS | Status: AC
  Filled 2021-05-19: qty 10

## 2021-05-19 MED ORDER — OXYCODONE HCL 5 MG/5ML PO SOLN: 5.0000 mg | Freq: Once | ORAL | Status: DC | PRN

## 2021-05-19 MED ORDER — ONDANSETRON HCL 4 MG/2ML IJ SOLN: 4.0000 mg | INTRAMUSCULAR | Status: DC | PRN

## 2021-05-19 MED ORDER — MIDAZOLAM HCL 2 MG/2ML IJ SOLN
INTRAMUSCULAR | Status: AC
  Filled 2021-05-19: qty 2

## 2021-05-19 MED ORDER — OXYCODONE HCL 5 MG PO TABS: 5.0000 mg | ORAL_TABLET | Freq: Once | ORAL | Status: DC | PRN

## 2021-05-19 MED ORDER — DEXMEDETOMIDINE (PRECEDEX) IN NS 20 MCG/5ML (4 MCG/ML) IV SYRINGE
PREFILLED_SYRINGE | INTRAVENOUS | Status: DC | PRN
  Administered 2021-05-19 (×3): 4 ug via INTRAVENOUS

## 2021-05-19 MED ORDER — ONDANSETRON HCL 4 MG PO TABS: 4.0000 mg | ORAL_TABLET | ORAL | Status: DC | PRN

## 2021-05-19 MED ORDER — LIDOCAINE 2% (20 MG/ML) 5 ML SYRINGE
INTRAMUSCULAR | Status: DC | PRN
  Administered 2021-05-19: 80 mg via INTRAVENOUS

## 2021-05-19 MED ORDER — ALBUTEROL SULFATE (2.5 MG/3ML) 0.083% IN NEBU: 3.0000 mL | INHALATION_SOLUTION | Freq: Four times a day (QID) | RESPIRATORY_TRACT | Status: DC | PRN

## 2021-05-19 MED ORDER — FENTANYL CITRATE (PF) 100 MCG/2ML IJ SOLN: 25.0000 ug | INTRAMUSCULAR | Status: DC | PRN

## 2021-05-19 SURGICAL SUPPLY — 44 items
BAG COUNTER SPONGE SURGICOUNT (BAG) ×5 IMPLANT
BAG SPNG CNTER NS LX DISP (BAG) ×3
BLADE SURG 15 STRL LF DISP TIS (BLADE) IMPLANT
BLADE SURG 15 STRL SS (BLADE)
CANISTER SUCT 3000ML PPV (MISCELLANEOUS) ×1 IMPLANT
CLEANER TIP ELECTROSURG 2X2 (MISCELLANEOUS) ×3 IMPLANT
CORD BIPOLAR FORCEPS 12FT (ELECTRODE) ×1 IMPLANT
COVER SURGICAL LIGHT HANDLE (MISCELLANEOUS) ×3 IMPLANT
DRAIN JP 15F RND TROCAR (DRAIN) ×1 IMPLANT
DRAPE HALF SHEET 40X57 (DRAPES) IMPLANT
ELECT COATED BLADE 2.86 ST (ELECTRODE) ×3 IMPLANT
ELECT REM PT RETURN 9FT ADLT (ELECTROSURGICAL) ×2
ELECTRODE REM PT RTRN 9FT ADLT (ELECTROSURGICAL) ×2 IMPLANT
EVACUATOR SILICONE 100CC (DRAIN) ×1 IMPLANT
FORCEPS BIPOLAR SPETZLER 8 1.0 (NEUROSURGERY SUPPLIES) ×1 IMPLANT
GAUZE 4X4 16PLY ~~LOC~~+RFID DBL (SPONGE) ×4 IMPLANT
GAUZE SPONGE 4X4 12PLY STRL (GAUZE/BANDAGES/DRESSINGS) IMPLANT
GLOVE SURG LTX SZ7.5 (GLOVE) ×3 IMPLANT
GLOVE SURG UNDER POLY LF SZ7 (GLOVE) ×1 IMPLANT
GOWN STRL REIN XL XLG (GOWN DISPOSABLE) ×1 IMPLANT
GOWN STRL REUS W/ TWL XL LVL3 (GOWN DISPOSABLE) ×4 IMPLANT
GOWN STRL REUS W/TWL XL LVL3 (GOWN DISPOSABLE) ×2
KIT BASIN OR (CUSTOM PROCEDURE TRAY) ×3 IMPLANT
KIT TURNOVER KIT B (KITS) ×3 IMPLANT
NDL 25GX 5/8IN NON SAFETY (NEEDLE) IMPLANT
NDL PRECISIONGLIDE 27X1.5 (NEEDLE) IMPLANT
NEEDLE 25GX 5/8IN NON SAFETY (NEEDLE) IMPLANT
NEEDLE PRECISIONGLIDE 27X1.5 (NEEDLE) ×2 IMPLANT
NS IRRIG 1000ML POUR BTL (IV SOLUTION) ×3 IMPLANT
PAD ARMBOARD 7.5X6 YLW CONV (MISCELLANEOUS) ×6 IMPLANT
PENCIL FOOT CONTROL (ELECTRODE) ×3 IMPLANT
SHEARS HARMONIC 9CM CVD (BLADE) ×1 IMPLANT
STAPLER SKIN 35 WIDE (STAPLE) ×1 IMPLANT
SUT CHROMIC 3 0 PS 2 (SUTURE) ×1 IMPLANT
SUT CHROMIC 4 0 P 3 18 (SUTURE) ×2 IMPLANT
SUT ETHILON 4 0 PS 2 18 (SUTURE) ×2 IMPLANT
SUT SILK 3 0 REEL (SUTURE) ×1 IMPLANT
SUT SILK 3 0 SH CR/8 (SUTURE) ×1 IMPLANT
SUT SILK 4 0 CR 8 RB 1 (SUTURE) ×1 IMPLANT
SUT SILK 4 0 REEL (SUTURE) ×1 IMPLANT
SYR BULB IRRIG 60ML STRL (SYRINGE) IMPLANT
TOWEL GREEN STERILE FF (TOWEL DISPOSABLE) ×3 IMPLANT
TRAY ENT MC OR (CUSTOM PROCEDURE TRAY) ×3 IMPLANT
YANKAUER SUCT BULB TIP NO VENT (SUCTIONS) IMPLANT

## 2021-05-19 NOTE — Anesthesia Preprocedure Evaluation (Addendum)
Anesthesia Evaluation  Patient identified by MRN, date of birth, ID band Patient awake    Reviewed: Allergy & Precautions, NPO status , Patient's Chart, lab work & pertinent test results  History of Anesthesia Complications (+) PONV and history of anesthetic complications  Airway Mallampati: III  TM Distance: >3 FB Neck ROM: Full    Dental  (+) Teeth Intact, Dental Advisory Given   Pulmonary neg pulmonary ROS,    Pulmonary exam normal breath sounds clear to auscultation       Cardiovascular negative cardio ROS Normal cardiovascular exam Rhythm:Regular Rate:Normal     Neuro/Psych  Headaches, Anxiety    GI/Hepatic negative GI ROS, Neg liver ROS,   Endo/Other  Hypothyroidism Morbid obesity  Renal/GU negative Renal ROS  negative genitourinary   Musculoskeletal negative musculoskeletal ROS (+) Right anterior neck mass   Abdominal (+) + obese,   Peds  Hematology negative hematology ROS (+)   Anesthesia Other Findings   Reproductive/Obstetrics                           Anesthesia Physical Anesthesia Plan  ASA: 3  Anesthesia Plan: General   Post-op Pain Management:    Induction: Intravenous  PONV Risk Score and Plan: 4 or greater and Treatment may vary due to age or medical condition, Scopolamine patch - Pre-op, Midazolam, Dexamethasone and Ondansetron  Airway Management Planned: LMA and Oral ETT  Additional Equipment: None  Intra-op Plan:   Post-operative Plan: Extubation in OR  Informed Consent: I have reviewed the patients History and Physical, chart, labs and discussed the procedure including the risks, benefits and alternatives for the proposed anesthesia with the patient or authorized representative who has indicated his/her understanding and acceptance.     Dental advisory given  Plan Discussed with: Anesthesiologist and CRNA  Anesthesia Plan Comments:         Anesthesia Quick Evaluation

## 2021-05-19 NOTE — Transfer of Care (Signed)
Immediate Anesthesia Transfer of Care Note  Patient: Alexis Knight  Procedure(s) Performed: Right neck dissection levels 1, 2, and 3 (Right: Neck)  Patient Location: PACU  Anesthesia Type:General  Level of Consciousness: drowsy  Airway & Oxygen Therapy: Patient Spontanous Breathing and Patient connected to face mask oxygen  Post-op Assessment: Report given to RN and Post -op Vital signs reviewed and stable  Post vital signs: Reviewed and stable  Last Vitals:  Vitals Value Taken Time  BP 176/63 05/19/21 1607  Temp    Pulse 88 05/19/21 1611  Resp 10 05/19/21 1611  SpO2 92 % 05/19/21 1611  Vitals shown include unvalidated device data.  Last Pain:  Vitals:   05/19/21 1042  TempSrc:   PainSc: 0-No pain         Complications: No notable events documented.

## 2021-05-19 NOTE — Progress Notes (Signed)
ENT Post Operative Note  Subjective: Patient seen and examined in PACU. Resting comfortably, no complaints.   Vitals:   05/19/21 1607 05/19/21 1622  BP: (!) 176/63 (!) 176/89  Pulse: 90 91  Resp: 18 14  Temp: (!) 97.4 F (36.3 C)   SpO2: 93% 98%     OBJECTIVE  Gen: alert, cooperative, appropriate Head/ENT: EOMI, mucus membranes moist and pink, conjunctiva clear Right neck incision C/D/I with staples intact. Neck soft, with no evidence of seroma or hematoma. JP drain exiting right neck with sanguinous drainage. Respiratory: Non-labored breathing, no accessory muscle use, good O2 saturations on oxygen via mask Neuro: Weakness of right marginal mandibular nerve noted  ASSESS/ PLAN  Alexis Knight is a 61 y.o. female who is POD 0 from supraomohyoid right neck dissection.  -Patient to be admitted as inpatient for observation, awaiting bed placement -Continue JP drain to bulb suction. Empty, record output, and recharge every 6 hours. -Encourage IS use, ambulation to tolerance with assist -Pain control -Advance diet as tolerated  Thank you for allowing me to participate in the care of this patient. Please do not hesitate to contact me with any questions or concerns.   Jason Coop, [facility] Cell: [phone]

## 2021-05-19 NOTE — Interval H&P Note (Signed)
History and Physical Interval Note:  05/19/2021 12:57 PM  Alexis Knight  has presented today for surgery, with the diagnosis of Lipoma of right neck.  The various methods of treatment have been discussed with the patient and family. After consideration of risks, benefits and other options for treatment, the patient has consented to  Procedure(s): RESECTION OF NECK MASS (N/A) as a surgical intervention.  The patient's history has been reviewed, patient examined, no change in status, stable for surgery.  I have reviewed the patient's chart and labs.  Questions were answered to the patient's satisfaction.     Izora Gala

## 2021-05-19 NOTE — Anesthesia Postprocedure Evaluation (Signed)
Anesthesia Post Note  Patient: Lydia Pardi  Procedure(s) Performed: Right neck dissection levels 1, 2, and 3 (Right: Neck)     Patient location during evaluation: PACU Anesthesia Type: General Level of consciousness: awake Pain management: pain level controlled Vital Signs Assessment: post-procedure vital signs reviewed and stable Respiratory status: spontaneous breathing, nonlabored ventilation, respiratory function stable and patient connected to nasal cannula oxygen Cardiovascular status: blood pressure returned to baseline and stable Postop Assessment: no apparent nausea or vomiting Anesthetic complications: no   No notable events documented.  Last Vitals:  Vitals:   05/19/21 1707 05/19/21 1722  BP: 138/61 (!) 150/71  Pulse: 85 88  Resp: 13 16  Temp:  36.4 C  SpO2: 95% 96%    Last Pain:  Vitals:   05/19/21 1753  TempSrc:   PainSc: 0-No pain                 Taccara Bushnell P Gwenn Teodoro

## 2021-05-19 NOTE — Op Note (Signed)
OPERATIVE REPORT  DATE OF SURGERY: 05/19/2021  PATIENT:  Alexis Knight,  61 y.o. female  PRE-OPERATIVE DIAGNOSIS:  Right neck mass  POST-OPERATIVE DIAGNOSIS:  Right neck mass  PROCEDURE:  Procedure(s): Right neck dissection levels 1, 2, and 3, resection of right neck lymphatic malformation  SURGEON:  Beckie Salts, MD  ASSISTANTS: RNFA  ANESTHESIA:   General   EBL: 100 ml  DRAINS: 15 French JP  LOCAL MEDICATIONS USED:  None  SPECIMEN: Right neck dissection levels 1, 2 and 3 for permanent  COUNTS:  Correct  PROCEDURE DETAILS: The patient was taken to the operating room and placed on the operating table in the supine position. Following induction of general endotracheal anesthesia, the right neck was prepped and draped in a standard fashion.  A prior scar was used and was lengthened anteriorly and posteriorly in the transverse and curvilinear fashion.  Electrocautery was used to incise the skin and subcutaneous tissue.  A subplatysmal flap was elevated and the lateral extremes of the dissection but at the central portion a subdermal dissection was accomplished as the lymphatic mass was intimate with the dermis.  The mass was felt to be intimate with the submandibular gland so the gland was removed.  Attempts were made to preserve the marginal branch of the facial nerve but this was very difficult due to the superficial extent of the lymphatic malformation.  What I was able to preserve was reflected up superiorly.  The dissection was then continued down towards the mandible.  The facial vessels were separately identified ligated and divided between clamps using 4-0 silk ties.  Dissection continued down towards the anterior belly of the digastric and the mylohyoid muscle.  Soft tissue was dissected off of the muscles.  The mylohyoid was reflected up superiorly exposing the submandibular duct, the sublingual gland and the lingual nerve.  The ganglion was sacrificed.  The duct and the gland  were divided and the submandibular gland was then brought inferiorly.  The lingual artery was ligated between clamps and divided.  Dissection continued more posteriorly where the tail of the parotid was also felt to be involved and was transected using the harmonic dissector.  The external jugular vein was sacrificed.  The mass was dissected anteriorly off of the upper half of the sternocleidomastoid muscle.  The spinal accessory nerve was identified and the dissection did not encompass the nerve so this was left unmolested.  The midportion of the muscle some fibrofatty tissue with what appeared to be some of the lymphatic malformation was dissected off of the posterior triangle and brought forward.  The carotid system and vagus were identified and preserved.  The internal jugular vein was identified.  The mass was intimate with the lateral aspect of the vein but the vein was preserved.  Sharp dissection was used to clean the lesion off of the vein.  Branches were ligated with 4-0 silk ties.  A single 4-0 silk stick tie was used on the internal jugular vein at 1 point.  The mass was brought forward.  The hypoglossal nerve was identified and preserved.  The mass was intimate with the posterior belly of the digastric muscle and a portion of the muscle was sacrificed.  The specimen was then brought more inferiorly and dissected completely off of the carotid and internal jugular.  Turning attention towards the inferior limit of the dissection the omohyoid muscle was sacrificed.  The lower portion of the internal jugular vein did not appear to be involved so  the lymphatic ducts were not identified.  The lesion was dissected off of the strap muscles and dissected off of the esophagus and then brought anteriorly.  Vision was dissected off of the fascia adjacent to the hyoid and the thyroid cartilages.  The pharynx was not involved.  Specimen was delivered and sent for pathologic evaluation.  The wound was irrigated  with saline.  Hemostasis was completed using electrocautery, bipolar cautery and 4-0 silk ties as needed.  3-0 silk ties were used on the larger caliber vessels.  The drain was exited through a separate stab incision secured in place with a silk suture.  The deep layer was reapproximated at the platysmal layer using interrupted 3-0 chromic's and skin staples were used on the skin.  The drain was charged and held good suction.  Bacitracin was applied on the skin surface.  Patient was awakened extubated and transferred to recovery in stable condition.    PATIENT DISPOSITION:  To PACU, stable

## 2021-05-19 NOTE — Progress Notes (Signed)
Patient arrived to [facility] room 30 alert and oriented x4 pain level 0. Bed in lowest position call light in reach will continue to monitor patient

## 2021-05-19 NOTE — Anesthesia Procedure Notes (Signed)
Procedure Name: Intubation Date/Time: 05/19/2021 1:37 PM Performed by: Lorie Phenix, CRNA Pre-anesthesia Checklist: Patient identified, Emergency Drugs available, Suction available and Patient being monitored Patient Re-evaluated:Patient Re-evaluated prior to induction Oxygen Delivery Method: Circle system utilized Preoxygenation: Pre-oxygenation with 100% oxygen Induction Type: IV induction Ventilation: Oral airway inserted - appropriate to patient size and Two handed mask ventilation required Laryngoscope Size: Mac and 3 Grade View: Grade I Tube type: Oral Tube size: 7.5 mm Number of attempts: 1 Airway Equipment and Method: Stylet Placement Confirmation: ETT inserted through vocal cords under direct vision, positive ETCO2 and breath sounds checked- equal and bilateral Secured at: 22 cm Tube secured with: Tape Dental Injury: Teeth and Oropharynx as per pre-operative assessment

## 2021-05-20 ENCOUNTER — Encounter (HOSPITAL_COMMUNITY): Payer: Self-pay | Admitting: Otolaryngology

## 2021-05-20 DIAGNOSIS — Z79890 Hormone replacement therapy (postmenopausal): Secondary | ICD-10-CM | POA: Diagnosis not present

## 2021-05-20 DIAGNOSIS — Z20822 Contact with and (suspected) exposure to covid-19: Secondary | ICD-10-CM | POA: Diagnosis not present

## 2021-05-20 DIAGNOSIS — R221 Localized swelling, mass and lump, neck: Secondary | ICD-10-CM | POA: Diagnosis not present

## 2021-05-20 DIAGNOSIS — K1123 Chronic sialoadenitis: Secondary | ICD-10-CM | POA: Diagnosis not present

## 2021-05-20 DIAGNOSIS — E039 Hypothyroidism, unspecified: Secondary | ICD-10-CM | POA: Diagnosis not present

## 2021-05-20 DIAGNOSIS — F17210 Nicotine dependence, cigarettes, uncomplicated: Secondary | ICD-10-CM | POA: Diagnosis not present

## 2021-05-20 NOTE — Progress Notes (Signed)
Patient taught how to empty JP drain and record output. Patient verbalized understanding of teaching.

## 2021-05-20 NOTE — Progress Notes (Signed)
Patient ID: Alexis Knight, female   DOB: 1960/11/16, 61 y.o.   MRN: [id number] Subjective: Complains of pain otherwise doing well.  Objective: Vital signs in last 24 hours: Temp:  [97.4 F (36.3 C)-99.2 F (37.3 C)] 97.9 F (36.6 C) (02/16 0826) Pulse Rate:  [73-91] 80 (02/16 0826) Resp:  [13-18] 18 (02/16 0826) BP: (103-176)/(49-89) 106/61 (02/16 0826) SpO2:  [88 %-98 %] 94 % (02/16 0826) Weight:  [106.6 kg] 106.6 kg (02/15 1031) Weight change:  Last BM Date : 05/18/21  Intake/Output from previous day: 02/15 0701 - 02/16 0700 In: 1939.4 [P.O.:240; I.V.:1699.4] Out: 229 [Drains:129; Blood:100] Intake/Output this shift: No intake/output data recorded.  PHYSICAL EXAM: Incision looks excellent.  Flaps are down.  Skin is healthy.  Drain is functioning.  Right lower lip is weak.  Lab Results: No results for input(s): WBC, HGB, HCT, PLT in the last 72 hours. BMET Recent Labs    05/19/21 1040  NA 140  K 3.9  CL 105  CO2 25  GLUCOSE 136*  BUN 12  CREATININE 0.72  CALCIUM 9.1    Studies/Results: No results found.  Medications: I have reviewed the patient's current medications.  Assessment/Plan: Postop day 1.  Doing well.  Anticipate keeping drain in for 3-5 days.  Ambulate today.  Discharge home with drain if she desires any time.  LOS: 1 day    Medical Decision Making: #/Complex Problems: 1  Data Reviewed:1  Management:1 (1-Straightforward, 2-Low, 3-Moderate, 4-High)   Izora Gala 05/20/2021, 8:52 AM

## 2021-05-21 DIAGNOSIS — K1123 Chronic sialoadenitis: Secondary | ICD-10-CM | POA: Diagnosis not present

## 2021-05-21 DIAGNOSIS — R221 Localized swelling, mass and lump, neck: Secondary | ICD-10-CM | POA: Diagnosis not present

## 2021-05-21 DIAGNOSIS — Z79890 Hormone replacement therapy (postmenopausal): Secondary | ICD-10-CM | POA: Diagnosis not present

## 2021-05-21 DIAGNOSIS — Z20822 Contact with and (suspected) exposure to covid-19: Secondary | ICD-10-CM | POA: Diagnosis not present

## 2021-05-21 DIAGNOSIS — E039 Hypothyroidism, unspecified: Secondary | ICD-10-CM | POA: Diagnosis not present

## 2021-05-21 DIAGNOSIS — F17210 Nicotine dependence, cigarettes, uncomplicated: Secondary | ICD-10-CM | POA: Diagnosis not present

## 2021-05-21 LAB — SURGICAL PATHOLOGY

## 2021-05-21 MED ORDER — HYDROCODONE-ACETAMINOPHEN 7.5-325 MG PO TABS: 1.0000 | ORAL_TABLET | Freq: Four times a day (QID) | ORAL | 0 refills | Status: DC | PRN

## 2021-05-21 NOTE — Discharge Summary (Signed)
Physician Discharge Summary  Patient ID: Alexis Knight MRN: [id number] DOB/AGE: 59-21-1962 59 y.o.  Admit date: 05/19/2021 Discharge date: 05/21/2021  Admission Diagnoses: Lymphatic malformation  Discharge Diagnoses:  Principal Problem:   Lymphatic malformation   Discharged Condition: good  Hospital Course: No complications.  Consults: none  Significant Diagnostic Studies: none  Treatments: surgery: Right neck dissection  Discharge Exam: Blood pressure 115/62, pulse 84, temperature 97.9 F (36.6 C), temperature source Oral, resp. rate 18, height 5' (1.524 m), weight 106.6 kg, SpO2 90 %. PHYSICAL EXAM: Incision intact.  Drain is holding discharge.  Mandibular branch right facial nerve.  Disposition: Discharge disposition: 01-Home or Self Care       Discharge Instructions     Diet - low sodium heart healthy   Complete by: As directed    Discharge wound care:   Complete by: As directed    Empty drain and record amount 2 or 3 times daily.  Recharge the drain each time.  Apply antibiotic ointment to the incision line and the drain site twice daily.   Increase activity slowly   Complete by: As directed       Allergies as of 05/21/2021       Reactions   Penicillamine Anaphylaxis   Penicillins Shortness Of Breath, Other (See Comments)   Pt CAN take amoxicillin and Keflex with no problems   Metronidazole Hives   Sulfa Antibiotics Rash        Medication List     TAKE these medications    albuterol 108 (90 Base) MCG/ACT inhaler Commonly known as: VENTOLIN HFA Inhale 2 puffs into the lungs every 6 (six) hours as needed for shortness of breath or wheezing.   azelastine 0.1 % nasal spray Commonly known as: ASTELIN Place 2 sprays into both nostrils 2 (two) times daily as needed for rhinitis. Use in each nostril as directed   cetirizine 10 MG tablet Commonly known as: ZYRTEC Take 10 mg by mouth daily as needed for allergies.   cholecalciferol 25 MCG (1000  UNIT) tablet Commonly known as: VITAMIN D3 Take 1,000 Units by mouth daily.   CRANBERRY PO Take 1 capsule by mouth daily.   fluticasone 0.05 % cream Commonly known as: CUTIVATE 1 application daily as needed (irritation).   folic acid 1 MG tablet Commonly known as: FOLVITE Take 1 mg by mouth daily.   HYDROcodone-acetaminophen 7.5-325 MG tablet Commonly known as: Norco Take 1 tablet by mouth every 6 (six) hours as needed for moderate pain.   levothyroxine 112 MCG tablet Commonly known as: SYNTHROID Take 112 mcg by mouth daily before breakfast.   metoprolol succinate 100 MG 24 hr tablet Commonly known as: TOPROL-XL Take 1 tablet (100 mg total) by mouth in the morning and at bedtime. Take with or immediately following a meal. What changed: how much to take   multivitamin with minerals tablet Take 1 tablet by mouth daily.   sertraline 100 MG tablet Commonly known as: ZOLOFT Take 100 mg by mouth 2 (two) times daily.               Discharge Care Instructions  (From admission, onward)           Start     Ordered   05/21/21 0000  Discharge wound care:       Comments: Empty drain and record amount 2 or 3 times daily.  Recharge the drain each time.  Apply antibiotic ointment to the incision line and the drain site twice daily.  05/21/21 0805             Signed: Izora Gala 05/21/2021, 8:05 AM

## 2021-05-21 NOTE — Progress Notes (Signed)
Patient ID: Alexis Knight, female   DOB: October 04, 1960, 61 y.o.   MRN: [id number] Subjective: No new problems or complaints.  Objective: Vital signs in last 24 hours: Temp:  [97.8 F (36.6 C)-97.9 F (36.6 C)] 97.9 F (36.6 C) (02/17 0727) Pulse Rate:  [75-88] 84 (02/17 0727) Resp:  [18] 18 (02/17 0727) BP: (106-118)/(54-62) 115/62 (02/17 0727) SpO2:  [89 %-94 %] 90 % (02/17 0727) Weight change:  Last BM Date : 05/18/21  Intake/Output from previous day: 02/16 0701 - 02/17 0700 In: 1759.6 [P.O.:240; I.V.:1519.6] Out: 35 [Drains:35] Intake/Output this shift: No intake/output data recorded.  PHYSICAL EXAM: Incision looks excellent.  No swelling.  Drainage is serous now decreasing.  She is tolerating oral liquids and food.  Pain is reasonably well controlled.  Lab Results: No results for input(s): WBC, HGB, HCT, PLT in the last 72 hours. BMET Recent Labs    05/19/21 1040  NA 140  K 3.9  CL 105  CO2 25  GLUCOSE 136*  BUN 12  CREATININE 0.72  CALCIUM 9.1    Studies/Results: No results found.  Medications: I have reviewed the patient's current medications.  Assessment/Plan: Postop day 2, progressing well.  Will discharge home with drain and follow-up in couple of days for drain removal.  LOS: 1 day    Medical Decision Making: #/Complex Problems: 1  Data Reviewed:1  Management:1 (1-Straightforward, 2-Low, 3-Moderate, 4-High)   Izora Gala 05/21/2021, 8:03 AM

## 2021-05-21 NOTE — Plan of Care (Signed)

## 2021-06-03 LAB — SURGICAL PATHOLOGY

## 2021-07-13 DIAGNOSIS — N898 Other specified noninflammatory disorders of vagina: Secondary | ICD-10-CM | POA: Diagnosis not present

## 2021-07-13 DIAGNOSIS — S3141XA Laceration without foreign body of vagina and vulva, initial encounter: Secondary | ICD-10-CM | POA: Diagnosis not present

## 2021-07-13 DIAGNOSIS — Z6841 Body Mass Index (BMI) 40.0 and over, adult: Secondary | ICD-10-CM | POA: Diagnosis not present

## 2021-07-13 DIAGNOSIS — Z113 Encounter for screening for infections with a predominantly sexual mode of transmission: Secondary | ICD-10-CM | POA: Diagnosis not present

## 2021-07-28 DIAGNOSIS — R350 Frequency of micturition: Secondary | ICD-10-CM | POA: Diagnosis not present

## 2021-07-28 DIAGNOSIS — F419 Anxiety disorder, unspecified: Secondary | ICD-10-CM | POA: Diagnosis not present

## 2021-07-28 DIAGNOSIS — E890 Postprocedural hypothyroidism: Secondary | ICD-10-CM | POA: Diagnosis not present

## 2021-08-13 DIAGNOSIS — Q899 Congenital malformation, unspecified: Secondary | ICD-10-CM | POA: Diagnosis not present

## 2021-08-16 DIAGNOSIS — Z01419 Encounter for gynecological examination (general) (routine) without abnormal findings: Secondary | ICD-10-CM | POA: Diagnosis not present

## 2021-08-16 DIAGNOSIS — Z6841 Body Mass Index (BMI) 40.0 and over, adult: Secondary | ICD-10-CM | POA: Diagnosis not present

## 2021-08-16 DIAGNOSIS — Z1272 Encounter for screening for malignant neoplasm of vagina: Secondary | ICD-10-CM | POA: Diagnosis not present

## 2021-09-08 DIAGNOSIS — R739 Hyperglycemia, unspecified: Secondary | ICD-10-CM | POA: Diagnosis not present

## 2021-09-08 DIAGNOSIS — R5383 Other fatigue: Secondary | ICD-10-CM | POA: Diagnosis not present

## 2021-09-08 DIAGNOSIS — F418 Other specified anxiety disorders: Secondary | ICD-10-CM | POA: Diagnosis not present

## 2021-09-08 DIAGNOSIS — R002 Palpitations: Secondary | ICD-10-CM | POA: Diagnosis not present

## 2021-09-08 DIAGNOSIS — R0602 Shortness of breath: Secondary | ICD-10-CM | POA: Diagnosis not present

## 2021-09-08 DIAGNOSIS — E039 Hypothyroidism, unspecified: Secondary | ICD-10-CM | POA: Diagnosis not present

## 2021-09-08 DIAGNOSIS — Z9189 Other specified personal risk factors, not elsewhere classified: Secondary | ICD-10-CM | POA: Diagnosis not present

## 2021-09-27 DIAGNOSIS — R002 Palpitations: Secondary | ICD-10-CM | POA: Diagnosis not present

## 2021-09-27 DIAGNOSIS — E119 Type 2 diabetes mellitus without complications: Secondary | ICD-10-CM | POA: Diagnosis not present

## 2021-09-27 DIAGNOSIS — E039 Hypothyroidism, unspecified: Secondary | ICD-10-CM | POA: Diagnosis not present

## 2021-09-27 DIAGNOSIS — E7800 Pure hypercholesterolemia, unspecified: Secondary | ICD-10-CM | POA: Diagnosis not present

## 2021-10-11 DIAGNOSIS — E7800 Pure hypercholesterolemia, unspecified: Secondary | ICD-10-CM | POA: Diagnosis not present

## 2021-10-11 DIAGNOSIS — E118 Type 2 diabetes mellitus with unspecified complications: Secondary | ICD-10-CM | POA: Diagnosis not present

## 2021-10-11 DIAGNOSIS — R002 Palpitations: Secondary | ICD-10-CM | POA: Diagnosis not present

## 2021-10-11 DIAGNOSIS — E039 Hypothyroidism, unspecified: Secondary | ICD-10-CM | POA: Diagnosis not present

## 2021-10-25 DIAGNOSIS — R002 Palpitations: Secondary | ICD-10-CM | POA: Diagnosis not present

## 2021-10-25 DIAGNOSIS — E118 Type 2 diabetes mellitus with unspecified complications: Secondary | ICD-10-CM | POA: Diagnosis not present

## 2021-10-25 DIAGNOSIS — E039 Hypothyroidism, unspecified: Secondary | ICD-10-CM | POA: Diagnosis not present

## 2021-10-25 DIAGNOSIS — E7800 Pure hypercholesterolemia, unspecified: Secondary | ICD-10-CM | POA: Diagnosis not present

## 2021-11-02 DIAGNOSIS — E119 Type 2 diabetes mellitus without complications: Secondary | ICD-10-CM | POA: Diagnosis not present

## 2021-11-02 DIAGNOSIS — F419 Anxiety disorder, unspecified: Secondary | ICD-10-CM | POA: Diagnosis not present

## 2021-11-02 DIAGNOSIS — R030 Elevated blood-pressure reading, without diagnosis of hypertension: Secondary | ICD-10-CM | POA: Diagnosis not present

## 2021-11-15 DIAGNOSIS — E559 Vitamin D deficiency, unspecified: Secondary | ICD-10-CM | POA: Diagnosis not present

## 2021-11-15 DIAGNOSIS — E039 Hypothyroidism, unspecified: Secondary | ICD-10-CM | POA: Diagnosis not present

## 2021-11-15 DIAGNOSIS — R718 Other abnormality of red blood cells: Secondary | ICD-10-CM | POA: Diagnosis not present

## 2021-11-15 DIAGNOSIS — E65 Localized adiposity: Secondary | ICD-10-CM | POA: Diagnosis not present

## 2021-12-30 DIAGNOSIS — E559 Vitamin D deficiency, unspecified: Secondary | ICD-10-CM | POA: Diagnosis not present

## 2021-12-30 DIAGNOSIS — E119 Type 2 diabetes mellitus without complications: Secondary | ICD-10-CM | POA: Diagnosis not present

## 2021-12-30 DIAGNOSIS — D509 Iron deficiency anemia, unspecified: Secondary | ICD-10-CM | POA: Diagnosis not present

## 2021-12-30 DIAGNOSIS — E039 Hypothyroidism, unspecified: Secondary | ICD-10-CM | POA: Diagnosis not present

## 2022-01-08 DIAGNOSIS — S62316A Displaced fracture of base of fifth metacarpal bone, right hand, initial encounter for closed fracture: Secondary | ICD-10-CM | POA: Diagnosis not present

## 2022-01-08 DIAGNOSIS — S62356A Nondisplaced fracture of shaft of fifth metacarpal bone, right hand, initial encounter for closed fracture: Secondary | ICD-10-CM | POA: Diagnosis not present

## 2022-01-17 DIAGNOSIS — S62316D Displaced fracture of base of fifth metacarpal bone, right hand, subsequent encounter for fracture with routine healing: Secondary | ICD-10-CM | POA: Diagnosis not present

## 2022-01-31 ENCOUNTER — Telehealth: Payer: Self-pay | Admitting: Cardiology

## 2022-01-31 DIAGNOSIS — Z6841 Body Mass Index (BMI) 40.0 and over, adult: Secondary | ICD-10-CM | POA: Diagnosis not present

## 2022-01-31 DIAGNOSIS — E559 Vitamin D deficiency, unspecified: Secondary | ICD-10-CM | POA: Diagnosis not present

## 2022-01-31 DIAGNOSIS — E118 Type 2 diabetes mellitus with unspecified complications: Secondary | ICD-10-CM | POA: Diagnosis not present

## 2022-01-31 DIAGNOSIS — E039 Hypothyroidism, unspecified: Secondary | ICD-10-CM | POA: Diagnosis not present

## 2022-01-31 MED ORDER — METOPROLOL SUCCINATE ER 100 MG PO TB24: 100.0000 mg | ORAL_TABLET | Freq: Two times a day (BID) | ORAL | 0 refills | Status: DC

## 2022-01-31 NOTE — Telephone Encounter (Signed)
Pt's medication was sent to pt's pharmacy as requested. Confirmation received.  °

## 2022-01-31 NOTE — Telephone Encounter (Signed)
*  STAT* If patient is at the pharmacy, call can be transferred to refill team.   1. Which medications need to be refilled? (please list name of each medication and dose if known)  metoprolol succinate (TOPROL-XL) 50 MG 24 hr tablet  2. Which pharmacy/location (including street and city if local pharmacy) is medication to be sent to? [facility] [id number] - [address], [place]  3. Do they need a 30 day or 90 day supply? 90 day  Patient has appt with Dr. Curt Bears on 04/13/22

## 2022-02-07 DIAGNOSIS — S62316D Displaced fracture of base of fifth metacarpal bone, right hand, subsequent encounter for fracture with routine healing: Secondary | ICD-10-CM | POA: Diagnosis not present

## 2022-03-09 DIAGNOSIS — S62316D Displaced fracture of base of fifth metacarpal bone, right hand, subsequent encounter for fracture with routine healing: Secondary | ICD-10-CM | POA: Diagnosis not present

## 2022-03-15 DIAGNOSIS — K5903 Drug induced constipation: Secondary | ICD-10-CM | POA: Diagnosis not present

## 2022-03-15 DIAGNOSIS — E559 Vitamin D deficiency, unspecified: Secondary | ICD-10-CM | POA: Diagnosis not present

## 2022-03-15 DIAGNOSIS — E118 Type 2 diabetes mellitus with unspecified complications: Secondary | ICD-10-CM | POA: Diagnosis not present

## 2022-03-15 DIAGNOSIS — S6291XA Unspecified fracture of right wrist and hand, initial encounter for closed fracture: Secondary | ICD-10-CM | POA: Diagnosis not present

## 2022-03-16 DIAGNOSIS — M79641 Pain in right hand: Secondary | ICD-10-CM | POA: Diagnosis not present

## 2022-03-24 DIAGNOSIS — M79641 Pain in right hand: Secondary | ICD-10-CM | POA: Diagnosis not present

## 2022-04-05 DIAGNOSIS — M79641 Pain in right hand: Secondary | ICD-10-CM | POA: Diagnosis not present

## 2022-04-06 DIAGNOSIS — S62316D Displaced fracture of base of fifth metacarpal bone, right hand, subsequent encounter for fracture with routine healing: Secondary | ICD-10-CM | POA: Diagnosis not present

## 2022-04-06 DIAGNOSIS — M79641 Pain in right hand: Secondary | ICD-10-CM | POA: Diagnosis not present

## 2022-04-06 DIAGNOSIS — M25641 Stiffness of right hand, not elsewhere classified: Secondary | ICD-10-CM | POA: Diagnosis not present

## 2022-04-06 DIAGNOSIS — M65341 Trigger finger, right ring finger: Secondary | ICD-10-CM | POA: Diagnosis not present

## 2022-04-13 ENCOUNTER — Encounter: Payer: Self-pay | Admitting: Cardiology

## 2022-04-13 ENCOUNTER — Ambulatory Visit: Payer: Federal, State, Local not specified - PPO | Attending: Cardiology | Admitting: Cardiology

## 2022-04-13 VITALS — BP 130/88 | HR 88 | Ht 60.0 in | Wt 212.0 lb

## 2022-04-13 DIAGNOSIS — I493 Ventricular premature depolarization: Secondary | ICD-10-CM

## 2022-04-13 NOTE — Patient Instructions (Signed)
  Medication Instructions:  Your physician recommends that you continue on your current medications as directed. Please refer to the Current Medication list given to you today.  *If you need a refill on your cardiac medications before your next appointment, please call your pharmacy*   Lab Work: None ordered  Testing/Procedures: None ordered   Follow-Up: At [facility], you and your health needs are our priority.  As part of our continuing mission to provide you with exceptional heart care, we have created designated Provider Care Teams.  These Care Teams include your primary Cardiologist (physician) and Advanced Practice Providers (APPs -  Physician Assistants and Nurse Practitioners) who all work together to provide you with the care you need, when you need it.  Your next appointment:   6 month(s)  The format for your next appointment:   In Person  Provider:   You will see one of the following Advanced Practice Providers on your designated Care Team:   Tommye Standard, [place] Legrand Como "Jonni Sanger" Chalmers Cater, [place]    Thank you for choosing [facility]!!   Trinidad Curet, RN [phone]  Other Instructions   Important Information About Sugar

## 2022-04-13 NOTE — Progress Notes (Signed)
Electrophysiology Office Note   Date:  04/13/2022   ID:  Alexis Knight, DOB Apr 02, 1961, MRN [id number]  PCP:  Josetta Huddle, MD  Cardiologist:  Johnsie Cancel Primary Electrophysiologist:  Eliott Amparan Meredith Leeds, MD    No chief complaint on file.     History of Present Illness: Alexis Knight is a 62 y.o. female who is being seen today for the evaluation of PVCs at the request of [place]. Presenting today for electrophysiology evaluation.    Significant headaches, anxiety, palpitations.  She was started on metoprolol with continued symptoms of palpitations though they were improved.  She has a history of PVCs.    Today, denies symptoms of chest pain, shortness of breath, orthopnea, PND, lower extremity edema, claudication, dizziness, presyncope, syncope, bleeding, or neurologic sequela. The patient is tolerating medications without difficulties.  She has been having more frequent palpitations.  Unfortunately she is going through quite a bit of stress.  She has had multiple deaths in the family and is actually getting sued by her brother.  This was started before Christmas which correlates to when her PVC symptoms started.   Past Medical History:  Diagnosis Date   Anxiety    Headache(784.0)    migraines hx   Hypothyroidism    Nasal sinus congestion    PONV (postoperative nausea and vomiting)    PVC's (premature ventricular contractions)    Past Surgical History:  Procedure Laterality Date   ABDOMINAL HYSTERECTOMY     ANTERIOR CERVICAL DECOMP/DISCECTOMY FUSION N/A 02/02/2017   Procedure: ANTERIOR CERVICAL DECOMPRESSION/DISCECTOMY FUSION C5-6, C6-7;  Surgeon: Melina Schools, MD;  Location: [facility];  Service: Orthopedics;  Laterality: N/A;   BLADDER SUSPENSION  01/17/2012   Procedure: TRANSVAGINAL TAPE (TVT) PROCEDURE;  Surgeon: Olga Millers, MD;  Location: [facility];  Service: Gynecology;  Laterality: N/A;  with cystoscopy and excision of hemmorhoid   CESAREAN SECTION     EXCISION  MASS NECK Right 05/19/2021   Procedure: Right neck dissection levels 1, 2, and 3;  Surgeon: Izora Gala, MD;  Location: [facility];  Service: ENT;  Laterality: Right;   REDUCTION MAMMAPLASTY Bilateral    SHOULDER ARTHROSCOPY Left    THYROIDECTOMY, PARTIAL     TONSILLECTOMY       Current Outpatient Medications  Medication Sig Dispense Refill   cetirizine (ZYRTEC) 10 MG tablet Take 10 mg by mouth daily as needed for allergies.     cholecalciferol (VITAMIN D3) 25 MCG (1000 UNIT) tablet Take 1,000 Units by mouth daily.     CRANBERRY PO Take 1 capsule by mouth daily.     fluticasone (CUTIVATE) 8.24 % cream 1 application daily as needed (irritation).     levothyroxine (SYNTHROID, LEVOTHROID) 112 MCG tablet Take 112 mcg by mouth daily before breakfast.      metoprolol succinate (TOPROL-XL) 100 MG 24 hr tablet Take 1 tablet (100 mg total) by mouth in the morning and at bedtime. Take with or immediately following a meal. 180 tablet 0   Multiple Vitamins-Minerals (MULTIVITAMIN WITH MINERALS) tablet Take 1 tablet by mouth daily.     OZEMPIC, 1 MG/DOSE, 4 MG/3ML SOPN Inject into the skin.     sertraline (ZOLOFT) 100 MG tablet Take 100 mg by mouth 2 (two) times daily.     Vitamin D, Ergocalciferol, (DRISDOL) 1.25 MG (50000 UNIT) CAPS capsule Take 50,000 Units by mouth once a week.     No current facility-administered medications for this visit.    Allergies:   Penicillamine, Penicillins, Metronidazole, and  Sulfa antibiotics   Social History:  The patient  reports that she has never smoked. She has never used smokeless tobacco. She reports that she does not drink alcohol and does not use drugs.   Family History:  The patient's family history includes Atrial fibrillation in her father; Breast cancer in her maternal aunt and paternal grandmother; Cervical cancer in her maternal grandmother; Dementia in her mother; Emphysema in her maternal grandfather; Heart attack in her father and maternal grandmother;  Hyperlipidemia in her brother; Hypertension in her brother; Prostate cancer in her maternal grandfather.   ROS:  Please see the history of present illness.   Otherwise, review of systems is positive for none.   All other systems are reviewed and negative.   PHYSICAL EXAM: VS:  BP 130/88   Pulse 88   Ht 5' (1.524 m)   Wt 212 lb (96.2 kg)   SpO2 98%   BMI 41.40 kg/m  , BMI Body mass index is 41.4 kg/m. GEN: Well nourished, well developed, in no acute distress  HEENT: normal  Neck: no JVD, carotid bruits, or masses Cardiac: RRR; no murmurs, rubs, or gallops,no edema  Respiratory:  clear to auscultation bilaterally, normal work of breathing GI: soft, nontender, nondistended, + BS MS: no deformity or atrophy  Skin: warm and dry Neuro:  Strength and sensation are intact Psych: euthymic mood, full affect  EKG:  EKG is ordered today. Personal review of the ekg ordered shows sinus rhythm, PVC    Recent Labs: 05/13/2021: Hemoglobin 13.2; Platelets 299 05/19/2021: BUN 12; Creatinine, Ser 0.72; Potassium 3.9; Sodium 140    Lipid Panel  No results found for: "CHOL", "TRIG", "HDL", "CHOLHDL", "VLDL", "LDLCALC", "LDLDIRECT"   Wt Readings from Last 3 Encounters:  04/13/22 212 lb (96.2 kg)  05/19/21 235 lb (106.6 kg)  05/13/21 237 lb (107.5 kg)      Other studies Reviewed: Additional studies/ records that were reviewed today include: TTE 05/12/17  Review of the above records today demonstrates:  - Left ventricle: The cavity size was normal. Systolic function was   at the lower limits of normal. The estimated ejection fraction   was = 50%. Wall motion was normal; there were no regional wall   motion abnormalities. Features are consistent with a pseudonormal   left ventricular filling pattern, with concomitant abnormal   relaxation and increased filling pressure (grade 2 diastolic   dysfunction). - Mitral valve: There was mild regurgitation. - Left atrium: The atrium was mildly  dilated.   Cardiac monitor 11/18/2019 personally reviewed Predominant rhythm sinus rhythm No atrial fibrillation noted 3% PVCs Symptoms of palpitations, rapid heart rate, chest pain associated with sinus rhythm and sinus tachycardia No other arrhythmias noted    ASSESSMENT AND PLAN:  1.  PVCs: Currently on metoprolol, dose above.  She is having more frequent PVCs, but is under quite a bit of stress.  At this point she does not wish to change her medication regimen.  Guneet Delpino see her back in 6 months and hopefully with relief of her stress, PVC symptoms Khristian Phillippi improve.  2.  Grade 2 diastolic dysfunction: No obvious volume overload.  3.  Morbid obesity: Diet and exercise encouraged.  Has lost 21 pounds.  Has gained 5 pounds with all of her stresses.  Feels better with weight loss. Body mass index is 41.4 kg/m.   Current medicines are reviewed at length with the patient today.   The patient does not have concerns regarding her medicines.  The following  changes were made today: none  Labs/ tests ordered today include:  Orders Placed This Encounter  Procedures   EKG 12-Lead      Disposition:   FU 6 months  Signed, Gamal Todisco Meredith Leeds, MD  04/13/2022 12:24 PM     [place] [address] [place] [address] [phone] (office) [phone] (fax)

## 2022-04-27 DIAGNOSIS — Z0000 Encounter for general adult medical examination without abnormal findings: Secondary | ICD-10-CM | POA: Diagnosis not present

## 2022-04-27 DIAGNOSIS — E559 Vitamin D deficiency, unspecified: Secondary | ICD-10-CM | POA: Diagnosis not present

## 2022-04-27 DIAGNOSIS — E1165 Type 2 diabetes mellitus with hyperglycemia: Secondary | ICD-10-CM | POA: Diagnosis not present

## 2022-04-27 DIAGNOSIS — E041 Nontoxic single thyroid nodule: Secondary | ICD-10-CM | POA: Diagnosis not present

## 2022-04-27 DIAGNOSIS — E890 Postprocedural hypothyroidism: Secondary | ICD-10-CM | POA: Diagnosis not present

## 2022-04-29 DIAGNOSIS — U071 COVID-19: Secondary | ICD-10-CM | POA: Diagnosis not present

## 2022-05-30 DIAGNOSIS — M25641 Stiffness of right hand, not elsewhere classified: Secondary | ICD-10-CM | POA: Diagnosis not present

## 2022-05-30 DIAGNOSIS — E118 Type 2 diabetes mellitus with unspecified complications: Secondary | ICD-10-CM | POA: Diagnosis not present

## 2022-05-30 DIAGNOSIS — S62316D Displaced fracture of base of fifth metacarpal bone, right hand, subsequent encounter for fracture with routine healing: Secondary | ICD-10-CM | POA: Diagnosis not present

## 2022-05-30 DIAGNOSIS — M65341 Trigger finger, right ring finger: Secondary | ICD-10-CM | POA: Diagnosis not present

## 2022-05-30 DIAGNOSIS — Z6841 Body Mass Index (BMI) 40.0 and over, adult: Secondary | ICD-10-CM | POA: Diagnosis not present

## 2022-06-08 DIAGNOSIS — F329 Major depressive disorder, single episode, unspecified: Secondary | ICD-10-CM | POA: Diagnosis not present

## 2022-06-08 DIAGNOSIS — R053 Chronic cough: Secondary | ICD-10-CM | POA: Diagnosis not present

## 2022-08-01 DIAGNOSIS — K5909 Other constipation: Secondary | ICD-10-CM | POA: Diagnosis not present

## 2022-08-01 DIAGNOSIS — E118 Type 2 diabetes mellitus with unspecified complications: Secondary | ICD-10-CM | POA: Diagnosis not present

## 2022-08-01 DIAGNOSIS — F329 Major depressive disorder, single episode, unspecified: Secondary | ICD-10-CM | POA: Diagnosis not present

## 2022-08-01 DIAGNOSIS — E039 Hypothyroidism, unspecified: Secondary | ICD-10-CM | POA: Diagnosis not present

## 2022-08-17 DIAGNOSIS — M65341 Trigger finger, right ring finger: Secondary | ICD-10-CM | POA: Diagnosis not present

## 2022-08-31 DIAGNOSIS — M25641 Stiffness of right hand, not elsewhere classified: Secondary | ICD-10-CM | POA: Diagnosis not present

## 2022-09-23 DIAGNOSIS — Z01419 Encounter for gynecological examination (general) (routine) without abnormal findings: Secondary | ICD-10-CM | POA: Diagnosis not present

## 2022-09-23 DIAGNOSIS — Z6839 Body mass index (BMI) 39.0-39.9, adult: Secondary | ICD-10-CM | POA: Diagnosis not present

## 2022-09-30 DIAGNOSIS — Z1231 Encounter for screening mammogram for malignant neoplasm of breast: Secondary | ICD-10-CM | POA: Diagnosis not present

## 2022-10-14 ENCOUNTER — Other Ambulatory Visit: Payer: Self-pay | Admitting: Internal Medicine

## 2022-10-14 ENCOUNTER — Encounter: Payer: Self-pay | Admitting: Internal Medicine

## 2022-10-14 ENCOUNTER — Ambulatory Visit
Admission: RE | Admit: 2022-10-14 | Discharge: 2022-10-14 | Disposition: A | Discharge status: Home / Self Care | Payer: Federal, State, Local not specified - PPO | Source: Ambulatory Visit | Attending: Internal Medicine | Admitting: Internal Medicine

## 2022-10-14 DIAGNOSIS — R1013 Epigastric pain: Secondary | ICD-10-CM

## 2022-10-14 DIAGNOSIS — R110 Nausea: Secondary | ICD-10-CM | POA: Diagnosis not present

## 2022-10-14 DIAGNOSIS — R1011 Right upper quadrant pain: Secondary | ICD-10-CM | POA: Diagnosis not present

## 2022-10-14 DIAGNOSIS — R197 Diarrhea, unspecified: Secondary | ICD-10-CM | POA: Diagnosis not present

## 2022-10-14 DIAGNOSIS — K5730 Diverticulosis of large intestine without perforation or abscess without bleeding: Secondary | ICD-10-CM | POA: Diagnosis not present

## 2022-10-14 MED ORDER — IOPAMIDOL (ISOVUE-300) INJECTION 61%
100.0000 mL | Freq: Once | INTRAVENOUS | Status: AC | PRN
  Administered 2022-10-14: 100 mL via INTRAVENOUS

## 2022-11-14 DIAGNOSIS — L538 Other specified erythematous conditions: Secondary | ICD-10-CM | POA: Diagnosis not present

## 2022-11-14 DIAGNOSIS — L218 Other seborrheic dermatitis: Secondary | ICD-10-CM | POA: Diagnosis not present

## 2022-11-14 DIAGNOSIS — L820 Inflamed seborrheic keratosis: Secondary | ICD-10-CM | POA: Diagnosis not present

## 2022-11-14 DIAGNOSIS — L718 Other rosacea: Secondary | ICD-10-CM | POA: Diagnosis not present

## 2022-11-17 ENCOUNTER — Ambulatory Visit: Payer: Federal, State, Local not specified - PPO | Admitting: Physician Assistant

## 2022-11-24 ENCOUNTER — Other Ambulatory Visit: Payer: Self-pay | Admitting: Medical Genetics

## 2022-11-24 DIAGNOSIS — Z006 Encounter for examination for normal comparison and control in clinical research program: Secondary | ICD-10-CM

## 2022-11-30 DIAGNOSIS — L718 Other rosacea: Secondary | ICD-10-CM | POA: Diagnosis not present

## 2022-11-30 DIAGNOSIS — I788 Other diseases of capillaries: Secondary | ICD-10-CM | POA: Diagnosis not present

## 2022-12-12 DIAGNOSIS — J209 Acute bronchitis, unspecified: Secondary | ICD-10-CM | POA: Diagnosis not present

## 2022-12-12 DIAGNOSIS — R051 Acute cough: Secondary | ICD-10-CM | POA: Diagnosis not present

## 2022-12-24 NOTE — Progress Notes (Unsigned)
Cardiology Office Note:  .   Date:  12/24/2022  ID:  Alexis Knight, DOB [birth year], MRN [id number] PCP: Alexis Noble, MD  [facility] Providers Cardiologist:  None Electrophysiologist:  Alexis Jorja Loa, MD {  History of Present Illness: [name] Kitchen   Alexis Knight is a 62 y.o. female w/PMHx of hypothyroidism, migraine HAs, PVCs, obesity  She saw Alexis Knight 04/13/22, palpitations increased at that visit though personal stressors also up., she had been having success with weight loss, until the personal issues started up, preferred no med changes and planned for a 6 mo f/u  Today's visit is scheduled as a 6 mo visit  ROS: ***  *** palps *** weight *** symptoms *** volume/diastolic dysfunction  Arrhythmia/AAD hx none  Studies Reviewed: [name] Kitchen    EKG done today and reviewed by myself:  ***   TTE 05/12/17  - Left ventricle: The cavity size was normal. Systolic function was   at the lower limits of normal. The estimated ejection fraction   was = 50%. Wall motion was normal; there were no regional wall   motion abnormalities. Features are consistent with a pseudonormal   left ventricular filling pattern, with concomitant abnormal   relaxation and increased filling pressure (grade 2 diastolic   dysfunction). - Mitral valve: There was mild regurgitation. - Left atrium: The atrium was mildly dilated.   Cardiac monitor 11/18/2019  Predominant rhythm sinus rhythm No atrial fibrillation noted 3% PVCs Symptoms of palpitations, rapid heart rate, chest pain associated with sinus rhythm and sinus tachycardia No other arrhythmias noted   Risk Assessment/Calculations:    Physical Exam:   VS:  There were no vitals taken for this visit.   Wt Readings from Last 3 Encounters:  04/13/22 212 lb (96.2 kg)  05/19/21 235 lb (106.6 kg)  05/13/21 237 lb (107.5 kg)    GEN: Well nourished, well developed in no acute distress NECK: No JVD; No carotid bruits CARDIAC: ***RRR, no murmurs, rubs,  gallops RESPIRATORY:  *** CTA b/l without rales, wheezing or rhonchi  ABDOMEN: Soft, non-tender, non-distended EXTREMITIES:  No edema; No deformity    ASSESSMENT AND PLAN: .    PVCs ***  2.   Diastolic dysfunction ***     {Are you ordering a CV Procedure (e.g. stress test, cath, DCCV, TEE, etc)?   Press F2        :[id number]}     Dispo: ***  Signed, Alexis Pigeon, PA-C

## 2022-12-26 ENCOUNTER — Ambulatory Visit: Payer: Federal, State, Local not specified - PPO | Attending: Physician Assistant | Admitting: Physician Assistant

## 2022-12-26 DIAGNOSIS — I493 Ventricular premature depolarization: Secondary | ICD-10-CM

## 2022-12-26 DIAGNOSIS — I5189 Other ill-defined heart diseases: Secondary | ICD-10-CM | POA: Diagnosis not present

## 2022-12-26 DIAGNOSIS — R002 Palpitations: Secondary | ICD-10-CM | POA: Diagnosis not present

## 2022-12-26 DIAGNOSIS — K589 Irritable bowel syndrome without diarrhea: Secondary | ICD-10-CM | POA: Diagnosis not present

## 2022-12-26 MED ORDER — METOPROLOL SUCCINATE ER 50 MG PO TB24: 50.0000 mg | ORAL_TABLET | Freq: Every day | ORAL | 3 refills | Status: AC

## 2022-12-26 NOTE — Patient Instructions (Addendum)
Medication Instructions:   START TAKING:  METOPROLOL 50 MG ONCE A DAY   AND YOU MAY TAKE  ADDITIONAL  50 MG TABLET AS NEEDED FOR PALPITATIONS   ( Please contact office if taking as needed 50 mg tablet frequently to adjust prescription)  *If you need a refill on your cardiac medications before your next appointment, please call your pharmacy*   Lab Work: NONE ORDERED  TODAY    If you have labs (blood work) drawn today and your tests are completely normal, you will receive your results only by: MyChart Message (if you have MyChart) OR A paper copy in the mail If you have any lab test that is abnormal or we need to change your treatment, we will call you to review the results.   Testing/Procedures: NONE ORDERED  TODAY     Follow-Up: At [facility], you and your health needs are our priority.  As part of our continuing mission to provide you with exceptional heart care, we have created designated Provider Care Teams.  These Care Teams include your primary Cardiologist (physician) and Advanced Practice Providers (APPs -  Physician Assistants and Nurse Practitioners) who all work together to provide you with the care you need, when you need it.  We recommend signing up for the patient portal called "MyChart".  Sign up information is provided on this After Visit Summary.  MyChart is used to connect with patients for Virtual Visits (Telemedicine).  Patients are able to view lab/test results, encounter notes, upcoming appointments, etc.  Non-urgent messages can be sent to your provider as well.   To learn more about what you can do with MyChart, go to [email or web address].    Your next appointment:   6 month(s)  Provider:   You may see Will Jorja Loa, MD or one of the following Advanced Practice Providers on your designated Care Team:   Francis Dowse, [place]   Other Instructions

## 2022-12-31 DIAGNOSIS — H6591 Unspecified nonsuppurative otitis media, right ear: Secondary | ICD-10-CM | POA: Diagnosis not present

## 2022-12-31 DIAGNOSIS — J069 Acute upper respiratory infection, unspecified: Secondary | ICD-10-CM | POA: Diagnosis not present

## 2023-02-01 ENCOUNTER — Other Ambulatory Visit
Admission: RE | Admit: 2023-02-01 | Discharge: 2023-02-01 | Disposition: A | Discharge status: Home / Self Care | Payer: Federal, State, Local not specified - PPO | Source: Ambulatory Visit | Attending: Oncology | Admitting: Oncology

## 2023-02-01 DIAGNOSIS — Z006 Encounter for examination for normal comparison and control in clinical research program: Secondary | ICD-10-CM | POA: Insufficient documentation

## 2023-02-11 LAB — GENECONNECT MOLECULAR SCREEN

## 2023-02-11 LAB — HELIX MOLECULAR SCREEN: Genetic Analysis Overall Interpretation: NEGATIVE

## 2023-04-03 DIAGNOSIS — J028 Acute pharyngitis due to other specified organisms: Secondary | ICD-10-CM | POA: Diagnosis not present

## 2023-04-03 DIAGNOSIS — R070 Pain in throat: Secondary | ICD-10-CM | POA: Diagnosis not present

## 2023-04-17 ENCOUNTER — Encounter (INDEPENDENT_AMBULATORY_CARE_PROVIDER_SITE_OTHER): Payer: Self-pay

## 2023-05-02 IMAGING — MG MM DIGITAL SCREENING BILAT W/ TOMO AND CAD
8 series · 8 of 24 positions shown · non-contrast
Comparison: Previous exam(s).

CLINICAL DATA: Screening.

EXAM:
DIGITAL SCREENING BILATERAL MAMMOGRAM WITH TOMOSYNTHESIS AND CAD
TECHNIQUE: Bilateral screening digital craniocaudal and mediolateral oblique
mammograms were obtained. Bilateral screening digital breast
tomosynthesis was performed. The images were evaluated with
computer-aided detection.

[L MLO synth-2D]
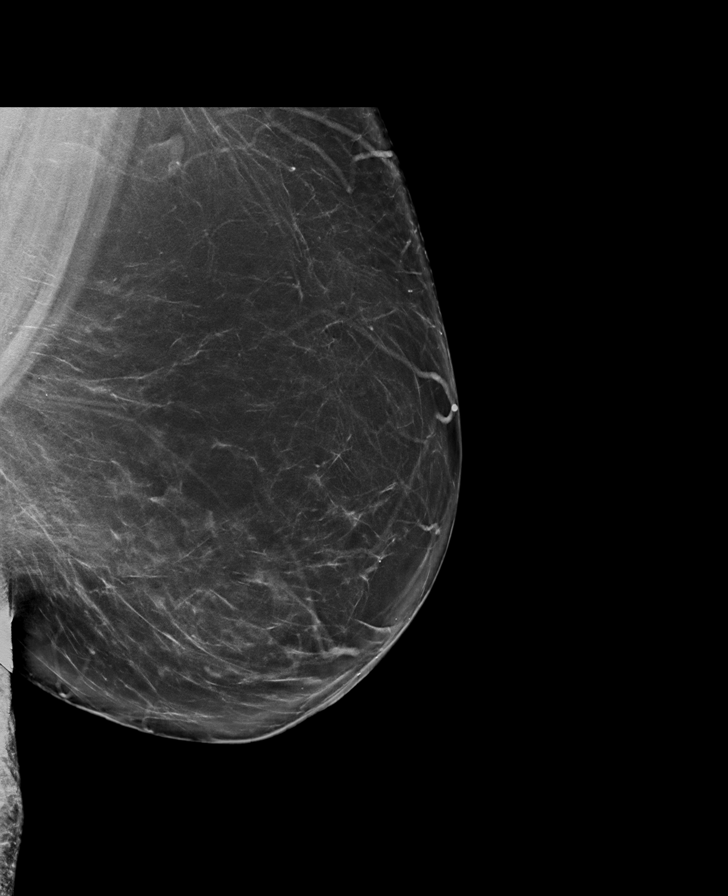

[R MLO synth-2D]
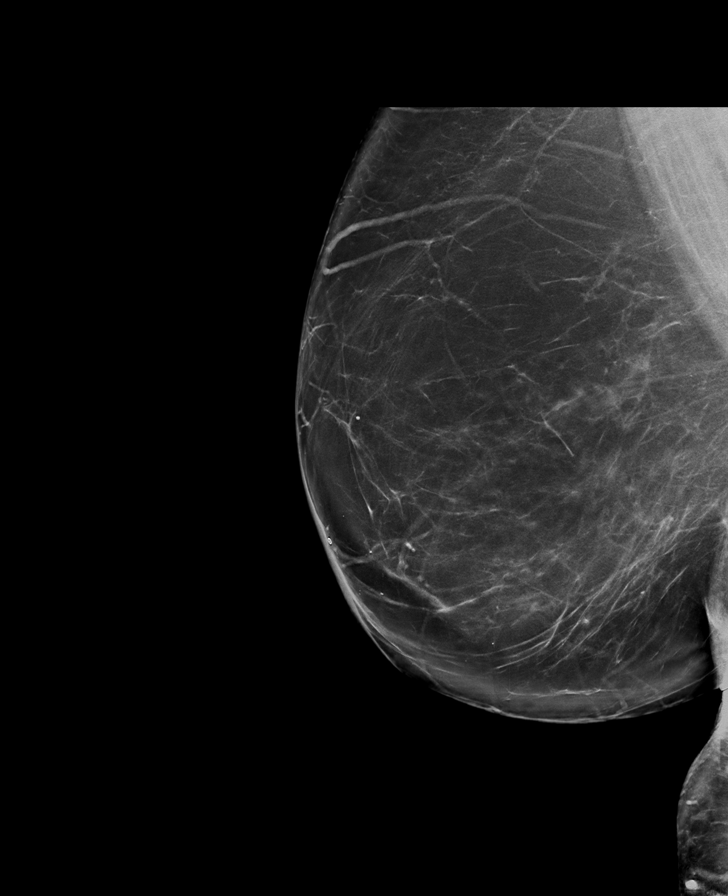

[L CC synth-2D]
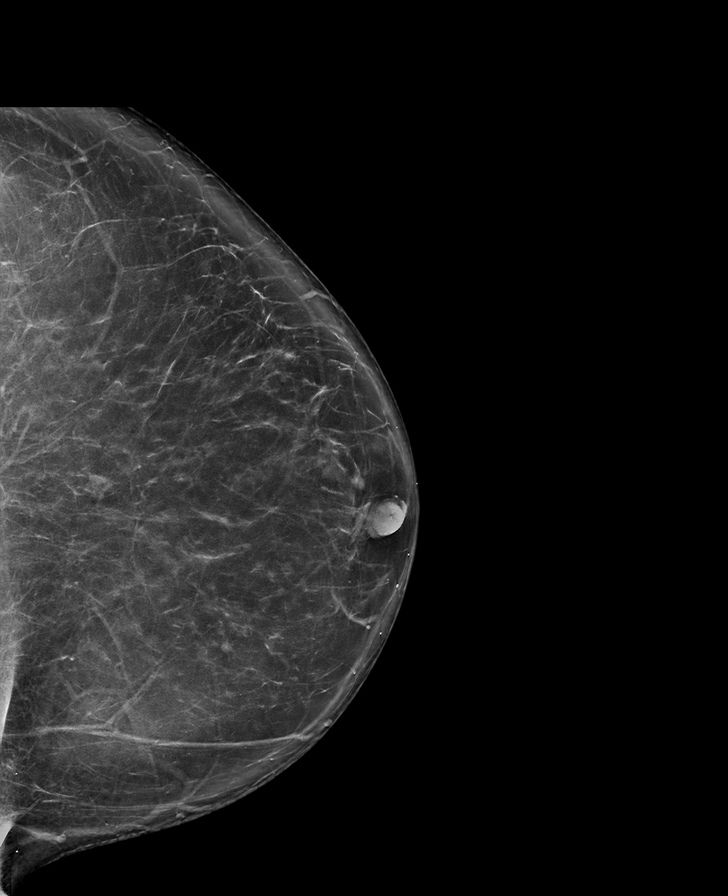

[R CC synth-2D]
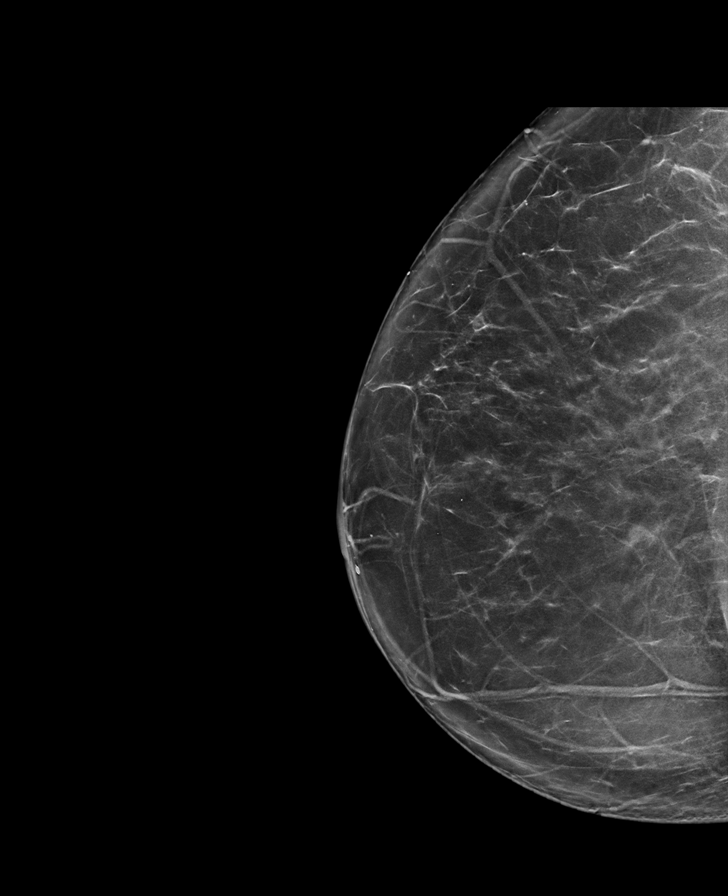

[R CC tomo · tomo slice 42/83.0]
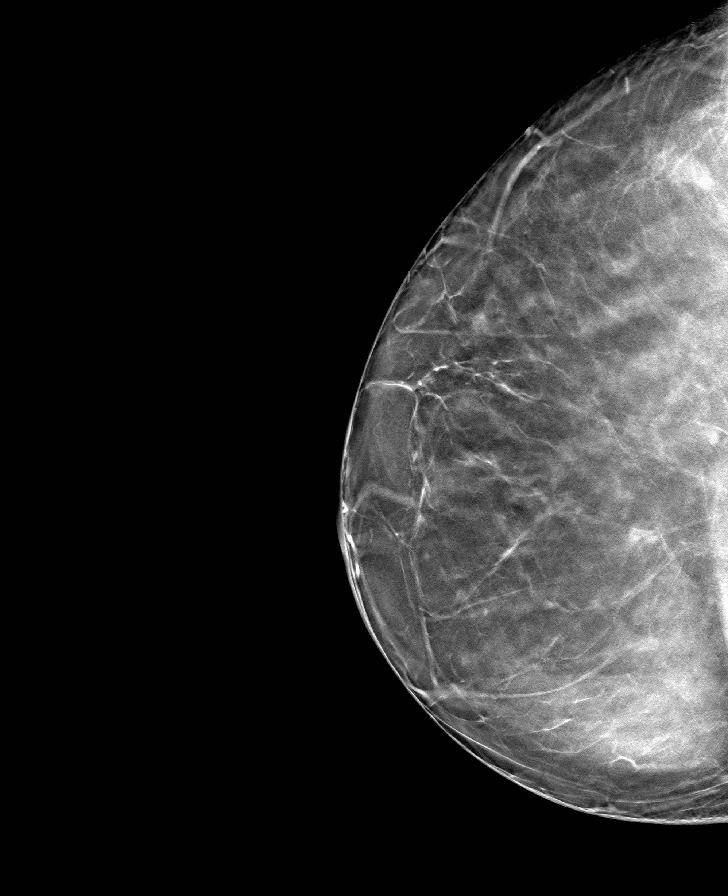

[R MLO tomo · tomo slice 50/99.0]
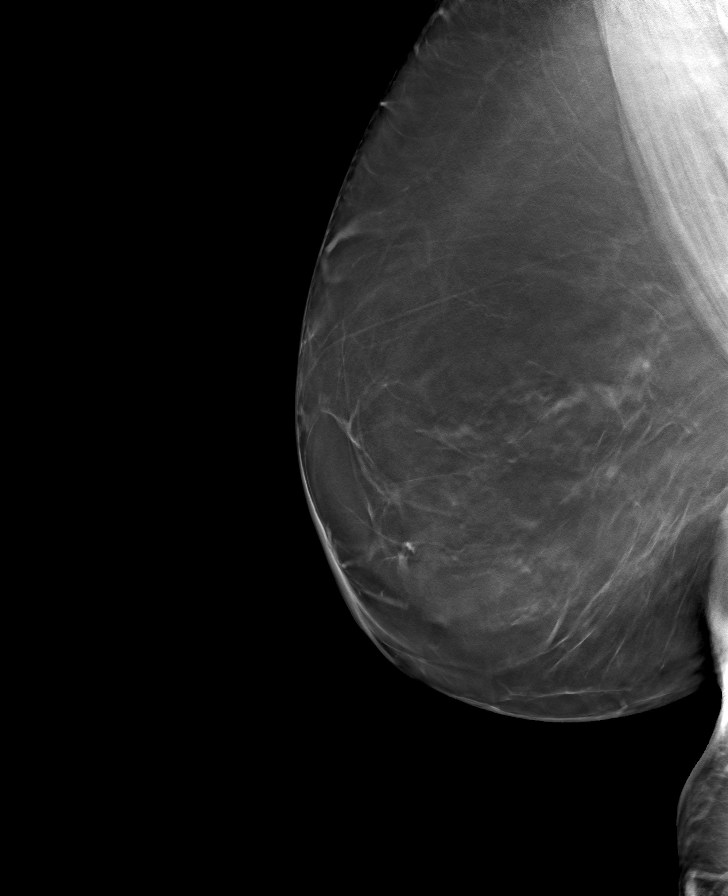

[L CC tomo · tomo slice 43/85.0]
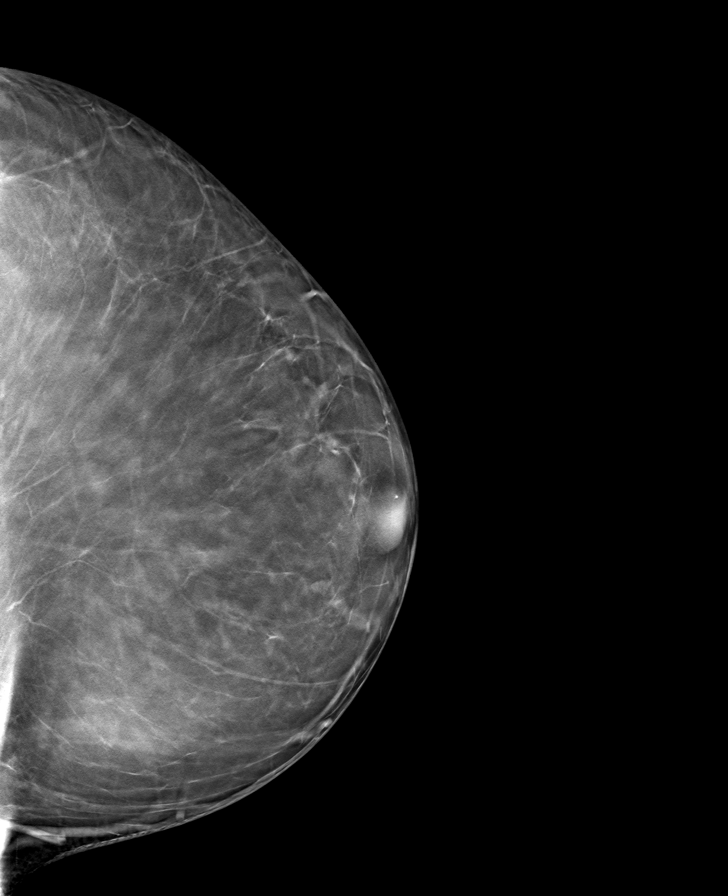

[L MLO tomo · tomo slice 51/100.0]
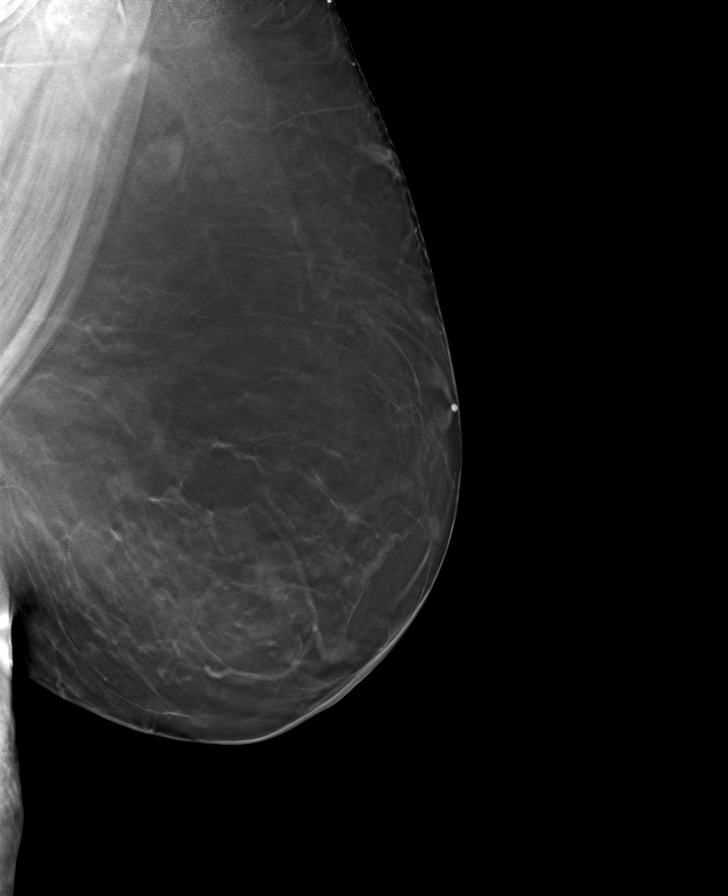

[8 of 24 positions shown; findings below may reference images not displayed]

ACR Breast Density Category b: There are scattered areas of
fibroglandular density.
FINDINGS: There are no findings suspicious for malignancy.
IMPRESSION: No mammographic evidence of malignancy. A result letter of this
screening mammogram will be mailed directly to the patient.

RECOMMENDATION:
Screening mammogram in one year. (Code:51-O-LD2)

BI-RADS CATEGORY  1: Negative.

## 2023-06-01 DIAGNOSIS — M1812 Unilateral primary osteoarthritis of first carpometacarpal joint, left hand: Secondary | ICD-10-CM | POA: Diagnosis not present

## 2023-06-01 DIAGNOSIS — M79645 Pain in left finger(s): Secondary | ICD-10-CM | POA: Diagnosis not present

## 2023-06-05 ENCOUNTER — Ambulatory Visit: Payer: Federal, State, Local not specified - PPO | Admitting: Cardiology

## 2023-07-03 ENCOUNTER — Ambulatory Visit: Payer: Federal, State, Local not specified - PPO | Attending: Cardiology | Admitting: Cardiology

## 2023-07-03 ENCOUNTER — Encounter: Payer: Self-pay | Admitting: Cardiology

## 2023-07-03 VITALS — BP 118/68 | HR 82 | Ht 60.0 in | Wt 222.0 lb

## 2023-07-03 DIAGNOSIS — R002 Palpitations: Secondary | ICD-10-CM | POA: Diagnosis not present

## 2023-07-03 DIAGNOSIS — I493 Ventricular premature depolarization: Secondary | ICD-10-CM | POA: Diagnosis not present

## 2023-07-03 NOTE — Progress Notes (Signed)
  Electrophysiology Office Note:   Date:  07/03/2023  ID:  Alexis Knight, DOB 1960-05-06, MRN [id number]  Primary Cardiologist: None Primary Heart Failure: None Electrophysiologist: Catalino Plascencia Jorja Loa, MD      History of Present Illness:   Alexis Knight is a 63 y.o. female with h/o PVCs seen today for routine electrophysiology followup.   Since last being seen in our clinic the patient reports doing well.  She has noted no PVCs or arrhythmias.  She has been happy with her control.  Unfortunately, she is going through quite a bit of stress in her work life.  She is working for the [employer] currently and is unsure about her future employment.  she denies chest pain, palpitations, dyspnea, PND, orthopnea, nausea, vomiting, dizziness, syncope, edema, weight gain, or early satiety.   Review of systems complete and found to be negative unless listed in HPI.   EP Information / Studies Reviewed:    EKG is ordered today. Personal review as below.  EKG Interpretation Date/Time:  Monday July 03 2023 16:17:28 EDT Ventricular Rate:  82 PR Interval:  124 QRS Duration:  82 QT Interval:  386 QTC Calculation: 450 R Axis:   7  Text Interpretation: Normal sinus rhythm Normal ECG When compared with ECG of 26-Dec-2022 08:30, No significant change was found Confirmed by [employer], Shawntavia Saunders ([id number]) on 07/03/2023 4:25:52 PM     Risk Assessment/Calculations:             Physical Exam:   VS:  BP 118/68 (BP Location: Left Arm, Patient Position: Sitting, Cuff Size: Large)   Pulse 82   Ht 5' (1.524 m)   Wt 222 lb (100.7 kg)   SpO2 97%   BMI 43.36 kg/m    Wt Readings from Last 3 Encounters:  07/03/23 222 lb (100.7 kg)  04/13/22 212 lb (96.2 kg)  05/19/21 235 lb (106.6 kg)     GEN: Well nourished, well developed in no acute distress NECK: No JVD; No carotid bruits CARDIAC: Regular rate and rhythm, no murmurs, rubs, gallops RESPIRATORY:  Clear to auscultation without rales, wheezing or rhonchi  ABDOMEN:  Soft, non-tender, non-distended EXTREMITIES:  No edema; No deformity   ASSESSMENT AND PLAN:    1.  PVCs: Currently on Toprol-XL 50 mg daily.  She is not having any further PVCs.  She is happy with her control.  She has been minimally symptomatic over the last few visits.  For now, we Dalia Jollie see her back on an as needed basis.  Her metoprolol can be prescribed by her primary physician.  If she has recurrent PVCs, would be happy to see her back.  2.  Chronic diastolic heart failure: No obvious volume overload  4.  Morbid obesity: Lifestyle modification encouraged  Follow up with Dr. Elberta Fortis  as needed   Signed, Loralie Malta Jorja Loa, MD

## 2023-07-26 DIAGNOSIS — M79641 Pain in right hand: Secondary | ICD-10-CM | POA: Diagnosis not present

## 2023-07-26 DIAGNOSIS — M1812 Unilateral primary osteoarthritis of first carpometacarpal joint, left hand: Secondary | ICD-10-CM | POA: Diagnosis not present

## 2023-09-05 DIAGNOSIS — G8918 Other acute postprocedural pain: Secondary | ICD-10-CM | POA: Diagnosis not present

## 2023-09-05 DIAGNOSIS — M1812 Unilateral primary osteoarthritis of first carpometacarpal joint, left hand: Secondary | ICD-10-CM | POA: Diagnosis not present

## 2023-09-20 DIAGNOSIS — Z4789 Encounter for other orthopedic aftercare: Secondary | ICD-10-CM | POA: Diagnosis not present

## 2023-10-04 DIAGNOSIS — M25642 Stiffness of left hand, not elsewhere classified: Secondary | ICD-10-CM | POA: Diagnosis not present

## 2023-10-16 DIAGNOSIS — M25642 Stiffness of left hand, not elsewhere classified: Secondary | ICD-10-CM | POA: Diagnosis not present

## 2023-10-23 DIAGNOSIS — M25642 Stiffness of left hand, not elsewhere classified: Secondary | ICD-10-CM | POA: Diagnosis not present

## 2023-11-06 DIAGNOSIS — Z4789 Encounter for other orthopedic aftercare: Secondary | ICD-10-CM | POA: Diagnosis not present

## 2023-11-06 DIAGNOSIS — M79642 Pain in left hand: Secondary | ICD-10-CM | POA: Diagnosis not present

## 2023-11-22 DIAGNOSIS — M25642 Stiffness of left hand, not elsewhere classified: Secondary | ICD-10-CM | POA: Diagnosis not present

## 2023-11-22 DIAGNOSIS — Z4789 Encounter for other orthopedic aftercare: Secondary | ICD-10-CM | POA: Diagnosis not present

## 2023-12-12 DIAGNOSIS — Z1231 Encounter for screening mammogram for malignant neoplasm of breast: Secondary | ICD-10-CM | POA: Diagnosis not present

## 2023-12-12 DIAGNOSIS — Z01419 Encounter for gynecological examination (general) (routine) without abnormal findings: Secondary | ICD-10-CM | POA: Diagnosis not present

## 2023-12-14 ENCOUNTER — Encounter: Payer: Self-pay | Admitting: *Deleted

## 2023-12-14 ENCOUNTER — Ambulatory Visit
Admission: EM | Admit: 2023-12-14 | Discharge: 2023-12-14 | Disposition: A | Discharge status: Home / Self Care | Attending: Family Medicine | Admitting: Family Medicine

## 2023-12-14 DIAGNOSIS — J069 Acute upper respiratory infection, unspecified: Secondary | ICD-10-CM

## 2023-12-14 MED ORDER — PREDNISONE 10 MG (21) PO TBPK: ORAL_TABLET | Freq: Every day | ORAL | 0 refills | Status: AC

## 2023-12-14 MED ORDER — BENZONATATE 100 MG PO CAPS: 100.0000 mg | ORAL_CAPSULE | Freq: Three times a day (TID) | ORAL | 0 refills | Status: AC

## 2023-12-14 MED ORDER — PROMETHAZINE-DM 6.25-15 MG/5ML PO SYRP: 5.0000 mL | ORAL_SOLUTION | Freq: Four times a day (QID) | ORAL | 0 refills | Status: AC | PRN

## 2023-12-14 MED ORDER — ALBUTEROL SULFATE HFA 108 (90 BASE) MCG/ACT IN AERS: 2.0000 | INHALATION_SPRAY | RESPIRATORY_TRACT | 0 refills | Status: AC | PRN

## 2023-12-14 MED ORDER — AZITHROMYCIN 250 MG PO TABS: ORAL_TABLET | ORAL | 0 refills | Status: AC

## 2023-12-14 NOTE — ED Triage Notes (Signed)
 Patient states 1 week of cough/congestion, feels like she had fever early on but none now.  Wheezing for the past 3 days, worse at night and somewhat short of breath at times.  Taking Mucinex DM with little relief.

## 2023-12-14 NOTE — Discharge Instructions (Signed)
 Stop by the pharmacy to pick up your prescriptions.  Follow up with your primary care provider or return to the urgent care, if not improving.

## 2023-12-14 NOTE — ED Provider Notes (Signed)
 [facility]    CSN: [id number] Arrival date & time: 12/14/23  1938      History   Chief Complaint Chief Complaint  Patient presents with   Cough   Nasal Congestion   Wheezing    HPI Alexis Knight is a 63 y.o. female.   HPI  History obtained from the patient. Starling presents for 2 days of wheezing, slight chest tightness and shortness of breath. Has been cough, nasal congestion, headache and fever for about a week.  Tmax 102 F but no fever in the past 3 days.  Taking Mucinex, Tylenol  and ibuprofen .  No medications for her symptoms since this morning.  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 Her son and his wife are sick.    Had asthma when she was younger. She doesn't have an inhaler at home.      Past Medical History:  Diagnosis Date   Anxiety    Headache(784.0)    migraines hx   Hypothyroidism    Nasal sinus congestion    PONV (postoperative nausea and vomiting)    PVC's (premature ventricular contractions)     Patient Active Problem List   Diagnosis Date Noted   Lymphatic malformation 05/19/2021   Localized swelling, mass and lump, upper limb 04/09/2018   Pain in joint of left elbow 02/06/2018   Traumatic rupture of biceps tendon 12/30/2017   History of thyroid  disorder 12/11/2017   Cervical spine pain 08/14/2017   Encounter for postoperative care 08/14/2017   S/P cervical spinal fusion 02/02/2017   Lichen sclerosus of female genitalia 06/08/2016   Seasonal allergic rhinitis 07/01/2015   Lymphadenopathy of head and neck 06/02/2015   Neck and shoulder pain 06/02/2015    Past Surgical History:  Procedure Laterality Date   ABDOMINAL HYSTERECTOMY     ANTERIOR CERVICAL DECOMP/DISCECTOMY FUSION N/A 02/02/2017   Procedure: ANTERIOR CERVICAL DECOMPRESSION/DISCECTOMY FUSION C5-6, C6-7;  Surgeon: Burnetta Aures, MD;  Location: [facility] OR;  Service: Orthopedics;  Laterality: N/A;   BLADDER SUSPENSION  01/17/2012   Procedure:  TRANSVAGINAL TAPE (TVT) PROCEDURE;  Surgeon: Oneil FORBES Piety, MD;  Location: [facility] ORS;  Service: Gynecology;  Laterality: N/A;  with cystoscopy and excision of hemmorhoid   CESAREAN SECTION     EXCISION MASS NECK Right 05/19/2021   Procedure: Right neck dissection levels 1, 2, and 3;  Surgeon: Jesus Oliphant, MD;  Location[facility] OR;  Service: ENT;  Laterality: Right;   REDUCTION MAMMAPLASTY Bilateral    SHOULDER ARTHROSCOPY Left    thumb joint replacement Left    THYROIDECTOMY, PARTIAL     TONSILLECTOMY      OB History   No obstetric history on file.      Home Medications    Prior to Admission medications   Medication Sig Start Date End Date Taking? Authorizing Provider  albuterol  (VENTOLIN  HFA) 108 (90 Base) MCG/ACT inhaler Inhale 2 puffs into the lungs every 4 (four) hours as needed. 12/14/23  Yes Orian Figueira, DO  azithromycin  (ZITHROMAX  Z-PAK) 250 MG tablet Take 2 tablets on day 1 then 1 tablet daily 12/14/23  Yes Channell Quattrone, DO  benzonatate  (TESSALON ) 100 MG capsule Take 1 capsule (100 mg total) by mouth every 8 (eight) hours. 12/14/23  Yes Haniel Fix, DO  predniSONE  (STERAPRED UNI-PAK 21 TAB) 10 MG (21) TBPK tablet Take by mouth daily. Take 6 tabs by mouth daily for 1, then 5 tabs for 1 day, then 4 tabs for 1 day, then  3 tabs for 1 day, then 2 tabs for 1 day, then 1 tab for 1 day. 12/14/23  Yes Lequita Meadowcroft, DO  promethazine -dextromethorphan (PROMETHAZINE -DM) 6.25-15 MG/5ML syrup Take 5 mLs by mouth 4 (four) times daily as needed. 12/14/23  Yes Keylin Podolsky, DO  cetirizine (ZYRTEC) 10 MG tablet Take 10 mg by mouth daily as needed for allergies.    [provider]  cholecalciferol  (VITAMIN D3) 25 MCG (1000 UNIT) tablet Take 1,000 Units by mouth daily.    [provider]  CRANBERRY PO Take 1 capsule by mouth daily.    [provider]  fluticasone (CUTIVATE) 0.05 % cream 1 application daily as needed (irritation).    [provider]   levothyroxine  (SYNTHROID , LEVOTHROID) 112 MCG tablet Take 112 mcg by mouth daily before breakfast.     [provider]  metoprolol  succinate (TOPROL -XL) 50 MG 24 hr tablet Take 1 tablet (50 mg total) by mouth daily. Take extra 50 mg as needed for palpitations. 12/26/22   Leverne Charlies Helling, PA-C  Multiple Vitamins-Minerals (MULTIVITAMIN WITH MINERALS) tablet Take 1 tablet by mouth daily.    [provider]  sertraline  (ZOLOFT ) 100 MG tablet Take 100 mg by mouth 2 (two) times daily. 08/11/19   [provider]  Vitamin D , Ergocalciferol , (DRISDOL) 1.25 MG (50000 UNIT) CAPS capsule Take 50,000 Units by mouth once a week. 03/23/22   [provider]  WELLBUTRIN  SR 100 MG 12 hr tablet Take 100 mg by mouth 2 (two) times daily.    [provider]    Family History Family History  Problem Relation Age of Onset   Dementia Mother    Heart attack Father    Atrial fibrillation Father    Breast cancer Maternal Aunt    Heart attack Maternal Grandmother    Cervical cancer Maternal Grandmother    Emphysema Maternal Grandfather    Prostate cancer Maternal Grandfather    Breast cancer Paternal Grandmother    Hyperlipidemia Brother    Hypertension Brother     Social History Social History   Tobacco Use   Smoking status: Never   Smokeless tobacco: Never  Vaping Use   Vaping status: Never Used  Substance Use Topics   Alcohol use: No   Drug use: No     Allergies   Penicillamine, Penicillins, Metronidazole, and Sulfa antibiotics   Review of Systems Review of Systems: negative unless otherwise stated in HPI.      Physical Exam Triage Vital Signs ED Triage Vitals  Encounter Vitals Group     BP 12/14/23 1951 (!) 142/83     Girls Systolic BP Percentile --      Girls Diastolic BP Percentile --      Boys Systolic BP Percentile --      Boys Diastolic BP Percentile --      Pulse Rate 12/14/23 1951 73     Resp 12/14/23 1951 20     Temp 12/14/23 1951  98.6 F (37 C)     Temp Source 12/14/23 1951 Oral     SpO2 12/14/23 1951 96 %     Weight 12/14/23 1948 221 lb 6.4 oz (100.4 kg)     Height --      Head Circumference --      Peak Flow --      Pain Score 12/14/23 1947 0     Pain Loc --      Pain Education --      Exclude from Growth Chart --  No data found.  Updated Vital Signs BP (!) 142/83 (BP Location: Right Arm)   Pulse 73   Temp 98.6 F (37 C) (Oral)   Resp 20   Wt 100.4 kg   SpO2 96%   BMI 43.24 kg/m   Visual Acuity Right Eye Distance:   Left Eye Distance:   Bilateral Distance:    Right Eye Near:   Left Eye Near:    Bilateral Near:     Physical Exam GEN:     alert, non-toxic appearing female in no distress    HENT:  mucus membranes moist, oropharyngeal without lesions or erythema, no tonsillar hypertrophy or exudates, moderate erythematous edematous turbinates, clear nasal discharge, bilateral TM normal EYES:   pupils equal and reactive, no scleral injection or discharge NECK:  normal ROM, no lymphadenopathy, no meningismus   RESP:  no increased work of breathing, productive cough, rhonchi bilaterally, no wheezing or rales  CVS:   regular rate and rhythm Skin:   warm and dry, no rash on visible skin    UC Treatments / Results  Labs (all labs ordered are listed, but only abnormal results are displayed) Labs Reviewed - No data to display  EKG   Radiology No results found.   Procedures Procedures (including critical care time)  Medications Ordered in UC Medications - No data to display  Initial Impression / Assessment and Plan / UC Course  I have reviewed the triage vital signs and the nursing notes.  Pertinent labs & imaging results that were available during my care of the patient were reviewed by me and considered in my medical decision making (see chart for details).       Pt is a 63 y.o. female who presents for ongoing respiratory symptoms. Liesl is afebrile here without recent  antipyretics. Satting well on room air. Overall pt is non-toxic appearing, well hydrated, without respiratory distress. Pulmonary exam is remarkable for scattered rhonchi and productive cough. COVID and influenza panel due to duration of symptoms. Likely has an acute viral respiratory illness. Discussed symptomatic treatment.  Explained lack of efficacy of antibiotics in viral disease however given her symptoms have been present for about a week will print prescription for azithromycin  to be used if not improving in 3 days.  Typical duration of symptoms discussed. Tessalon  Perles and Promethazine  DM for cough. Albuterol  inhaler for shortness of breath. Prednisone  taper provided. Declined work note.   Return and ED precautions given and voiced understanding. Discussed MDM, treatment plan and plan for follow-up with patient who agrees with plan.     Final Clinical Impressions(s) / UC Diagnoses   Final diagnoses:  URI with cough and congestion     Discharge Instructions      Stop by the pharmacy to pick up your prescriptions.  Follow up with your primary care provider or return to the urgent care, if not improving.       ED Prescriptions     Medication Sig Dispense Auth. Provider   promethazine -dextromethorphan (PROMETHAZINE -DM) 6.25-15 MG/5ML syrup Take 5 mLs by mouth 4 (four) times daily as needed. 118 mL Wiley Flicker, DO   benzonatate  (TESSALON ) 100 MG capsule Take 1 capsule (100 mg total) by mouth every 8 (eight) hours. 21 capsule Dewaun Kinzler, DO   albuterol  (VENTOLIN  HFA) 108 (90 Base) MCG/ACT inhaler Inhale 2 puffs into the lungs every 4 (four) hours as needed. 6.7 g Renton Berkley, DO   azithromycin  (ZITHROMAX  Z-PAK) 250 MG tablet Take 2 tablets on day 1  then 1 tablet daily 6 tablet Dahir Ayer, DO   predniSONE  (STERAPRED UNI-PAK 21 TAB) 10 MG (21) TBPK tablet Take by mouth daily. Take 6 tabs by mouth daily for 1, then 5 tabs for 1 day, then 4 tabs for 1 day, then 3 tabs  for 1 day, then 2 tabs for 1 day, then 1 tab for 1 day. 21 tablet Shamera Yarberry, DO      I have reviewed the PDMP during this encounter.   Jayziah Bankhead, DO 12/14/23 2027

## 2024-01-04 DIAGNOSIS — R059 Cough, unspecified: Secondary | ICD-10-CM | POA: Diagnosis not present

## 2024-01-04 DIAGNOSIS — R052 Subacute cough: Secondary | ICD-10-CM | POA: Diagnosis not present

## 2024-01-04 DIAGNOSIS — J209 Acute bronchitis, unspecified: Secondary | ICD-10-CM | POA: Diagnosis not present

## 2024-01-04 DIAGNOSIS — G43101 Migraine with aura, not intractable, with status migrainosus: Secondary | ICD-10-CM | POA: Diagnosis not present

## 2024-01-04 DIAGNOSIS — J9811 Atelectasis: Secondary | ICD-10-CM | POA: Diagnosis not present

## 2024-01-04 DIAGNOSIS — E1165 Type 2 diabetes mellitus with hyperglycemia: Secondary | ICD-10-CM | POA: Diagnosis not present

## 2024-01-04 DIAGNOSIS — Z0000 Encounter for general adult medical examination without abnormal findings: Secondary | ICD-10-CM | POA: Diagnosis not present

## 2024-01-04 DIAGNOSIS — E559 Vitamin D deficiency, unspecified: Secondary | ICD-10-CM | POA: Diagnosis not present

## 2024-01-26 DIAGNOSIS — J984 Other disorders of lung: Secondary | ICD-10-CM | POA: Diagnosis not present

## 2024-01-26 DIAGNOSIS — R9389 Abnormal findings on diagnostic imaging of other specified body structures: Secondary | ICD-10-CM | POA: Diagnosis not present
